# Patient Record
Sex: Male | Born: 1955 | Race: White | Hispanic: No | Marital: Single | Smoking: Never smoker
Health system: Southern US, Community
[De-identification: ages and names within clinical notes are randomized; demographics above are authoritative.]

## PROBLEM LIST (undated history)

## (undated) DIAGNOSIS — I10 Essential (primary) hypertension: Secondary | ICD-10-CM

## (undated) DIAGNOSIS — E785 Hyperlipidemia, unspecified: Secondary | ICD-10-CM

## (undated) HISTORY — DX: Essential (primary) hypertension: I10

## (undated) HISTORY — PX: HERNIA REPAIR: SHX51

## (undated) HISTORY — PX: CARDIAC SURGERY: SHX584

## (undated) HISTORY — DX: Hyperlipidemia, unspecified: E78.5

---

## 1998-09-11 ENCOUNTER — Ambulatory Visit (HOSPITAL_BASED_OUTPATIENT_CLINIC_OR_DEPARTMENT_OTHER): Admission: RE | Admit: 1998-09-11 | Discharge: 1998-09-11 | Payer: Self-pay | Admitting: Surgery

## 2000-09-13 ENCOUNTER — Encounter (HOSPITAL_COMMUNITY): Admission: RE | Admit: 2000-09-13 | Discharge: 2000-10-13 | Payer: Self-pay | Admitting: Preventative Medicine

## 2006-05-10 HISTORY — PX: COLONOSCOPY: SHX174

## 2007-04-17 ENCOUNTER — Ambulatory Visit (HOSPITAL_COMMUNITY): Admission: RE | Admit: 2007-04-17 | Discharge: 2007-04-17 | Payer: Self-pay | Admitting: Internal Medicine

## 2007-04-17 ENCOUNTER — Ambulatory Visit: Payer: Self-pay | Admitting: Internal Medicine

## 2009-09-26 ENCOUNTER — Encounter: Admission: RE | Admit: 2009-09-26 | Discharge: 2009-09-26 | Payer: Self-pay | Admitting: Otolaryngology

## 2010-09-22 NOTE — Op Note (Signed)
Victor Patel, Victor Patel              ACCOUNT NO.:  [id number]   MEDICAL RECORD NO.:  [id number]          PATIENT TYPE:  AMB   LOCATION:  DAY                           FACILITY:  [facility]   PHYSICIAN:  R. Roetta Sessions, M.D. DATE OF BIRTH:  03/11/1956   DATE OF PROCEDURE:  04/17/2007  DATE OF DISCHARGE:                               OPERATIVE REPORT   PROCEDURE:  Ileal colonoscopy.   INDICATIONS FOR PROCEDURE:  A 55 year old gentleman sent over at the  courtesy of W. Simone Curia, M.D., for colorectal cancer screening.  He is devoid of the lower GI tract symptoms.  He has never his lower GI  tract imaged.  There is no family history colorectal neoplasia.  Colonoscopy is now being done as a screening maneuver.  This approach  has been discussed with the patient at length.  The potential  risks,  benefits and alternatives have been reviewed, questions answered, he is  agreeable.  Please see the documentation in the medical record.   PROCEDURE NOTE:  O2 saturation, blood pressure, pulse and respirations  were monitor throughout the entirety of the procedure.   CONSCIOUS SEDATION:  Versed 5 mg IV, Demerol 75 mg IV in divided doses.   INSTRUMENT:  Pentax video chip system.   FINDINGS:  Digital rectal exam revealed no abnormalities.   ENDOSCOPIC FINDINGS:  The prep was good.   Colon:  Colonic mucosa was surveyed from the rectosigmoid junction,  through the left transverse right colon to the area of the appendiceal  orifice and ileocecal valve and cecum.  These structures were well seen  and photographed for the record.  Terminal ileum was intubated at 10 cm.  From this level, the scope was slowly, cautiously withdrawn.  All  previously mentioned mucosal surfaces were again seen.  The colonic  mucosa appeared normal as did the terminal ileal mucosa.  The scope was  pulled out of the rectum where thorough examination of the rectal mucosa  including retroflexed view of the anal verge  demonstrated only some  minimal internal hemorrhoids.  The patient tolerated the procedure well  as reactive to endoscopy.   IMPRESSION:  Minimal internal hemorrhoids, otherwise normal rectum,  colon and terminal ileum.   RECOMMENDATIONS:  Repeat screening colonoscopy in 10 years.      Jonathon Bellows, M.D.  Electronically Signed     RMR/MEDQ  D:  04/17/2007  T:  04/17/2007  Job:  [id number]   cc:   Donna Bernard, M.D.  Fax: [phone]

## 2012-08-14 ENCOUNTER — Ambulatory Visit (INDEPENDENT_AMBULATORY_CARE_PROVIDER_SITE_OTHER): Payer: PRIVATE HEALTH INSURANCE | Admitting: Surgery

## 2012-08-14 ENCOUNTER — Encounter (INDEPENDENT_AMBULATORY_CARE_PROVIDER_SITE_OTHER): Payer: Self-pay | Admitting: Surgery

## 2012-08-14 VITALS — BP 110/60 | HR 78 | Temp 97.6°F | Resp 18 | Ht 69.0 in | Wt 170.0 lb

## 2012-08-14 DIAGNOSIS — K4090 Unilateral inguinal hernia, without obstruction or gangrene, not specified as recurrent: Secondary | ICD-10-CM

## 2012-08-14 DIAGNOSIS — D492 Neoplasm of unspecified behavior of bone, soft tissue, and skin: Secondary | ICD-10-CM

## 2012-08-14 NOTE — Patient Instructions (Signed)
[facility]  HERNIA REPAIR POST OP INSTRUCTIONS  Always review your discharge instruction sheet given to you by the facility where your surgery was performed.  1. A  prescription for pain medication may be given to you upon discharge.  Take your pain medication as prescribed.  If narcotic pain medicine is not needed, then you may take acetaminophen (Tylenol) or ibuprofen (Advil) as needed.  2. Take your usually prescribed medications unless otherwise directed.  3. If you need a refill on your pain medication, please contact your pharmacy.  They will contact our office to request authorization. Prescriptions will not be filled after 5 pm daily or on weekends.  4. You should follow a light diet the first 24 hours after arrival home, such as soup and crackers or toast.  Be sure to include plenty of fluids daily.  Resume your normal diet the day after surgery.  5. Most patients will experience some swelling and bruising around the surgical site.  Ice packs and reclining will help.  Swelling and bruising can take several days to resolve.   6. It is common to experience some constipation if taking pain medication after surgery.  Increasing fluid intake and taking a stool softener (such as Colace) will usually help or prevent this problem from occurring.  A mild laxative (Milk of Magnesia or Miralax) should be taken according to package directions if there are no bowel movements after 48 hours.  7. Unless discharge instructions indicate otherwise, you may remove your bandages 24-48 hours after surgery, and you may shower at that time.  You may have steri-strips (small skin tapes) in place directly over the incision.  These strips should be left on the skin for 7-10 days.  If your surgeon used skin glue on the incision, you may shower in 24 hours.  The glue will flake off over the next 2-3 weeks.  Any sutures or staples will be removed at the office during your follow-up  visit.  8. ACTIVITIES:  You may resume regular (light) daily activities beginning the next day-such as daily self-care, walking, climbing stairs-gradually increasing activities as tolerated.  You may have sexual intercourse when it is comfortable.  Refrain from any heavy lifting or straining until approved by your doctor.  You may drive when you are no longer taking prescription pain medication, you can comfortably wear a seatbelt, and you can safely maneuver your car and apply brakes.  9. You should see your doctor in the office for a follow-up appointment approximately 2-3 weeks after your surgery.  Make sure that you call for this appointment within a day or two after you arrive home to insure a convenient appointment time. 10.   WHEN TO CALL YOUR DOCTOR: 1. Fever greater than 101.0 2. Inability to urinate 3. Persistent nausea and/or vomiting 4. Extreme swelling or bruising 5. Continued bleeding from incision 6. Increased pain, redness, or drainage from the incision  The clinic staff is available to answer your questions during regular business hours.  Please don't hesitate to call and ask to speak to one of the nurses for clinical concerns.  If you have a medical emergency, go to the nearest emergency room or call 911.  A surgeon from [facility] is always on call for the hospital.   [facility] [address], [address], [place], [place]  [address]  [phone] ? [phone] ? FAX [phone]  [email or web address]   

## 2012-08-14 NOTE — Progress Notes (Signed)
General Surgery [facility]  Chief Complaint  Patient presents with  . New Evaluation    eval inguinal hernia    HISTORY: Patient is a 57 year old white male who is self-referred for suspected inguinal hernia. Patient first noted discomfort and a bulge in the right groin in January 2014. He has had mild pain in the left groin. He has had no prior hernia repairs. He has had no prior abdominal surgery. Patient also notes a soft tissue mass on the left hip. This causes discomfort when sitting, especially in a hard chair or bench. He has gradually increased in size. He desires evaluation and surgical management for both problems.  Patient denies any signs or symptoms of intestinal obstruction.  Significant past medical history is repair of a cardiac septal defect as a 64-year-old child at [facility]. He has had no complications. He does not require antibiotic prophylaxis for dental procedures.  History reviewed. No pertinent past medical history.   No current outpatient prescriptions on file.   No current facility-administered medications for this visit.     No Known Allergies   No family history on file.   History   Social History  . Marital Status: Single    Spouse Name: N/A    Number of Children: N/A  . Years of Education: N/A   Social History Main Topics  . Smoking status: Never Smoker   . Smokeless tobacco: None  . Alcohol Use: No  . Drug Use: No  . Sexually Active: None   Other Topics Concern  . None   Social History Narrative  . None     REVIEW OF SYSTEMS - PERTINENT POSITIVES ONLY: Denies signs or symptoms of intestinal obstruction. Always reducible. No prior hernia surgery.  EXAM: Filed Vitals:   08/14/12 0902  BP: 110/60  Pulse: 78  Temp: 97.6 F (36.4 C)  Resp: 18    HEENT: normocephalic; pupils equal and reactive; sclerae clear; dentition good; mucous membranes moist NECK:  symmetric on extension; no  palpable anterior or posterior cervical lymphadenopathy; no supraclavicular masses; no tenderness CHEST: clear to auscultation bilaterally without rales, rhonchi, or wheezes CARDIAC: regular rate and rhythm without significant murmur; peripheral pulses are full ABDOMEN: soft without distension; bowel sounds present; no mass; no hepatosplenomegaly; no hernia GU:  Normal male without lesion. Small bulge in right groin on visual inspection. Palpation in inguinal canal with cough and Valsalva shows a small inguinal hernia which is spontaneously reducible. It is mildly tender. Palpation in the left inguinal canal shows no sign of inguinal hernia. BACK:  Soft tissue mass over posterior iliac crest, approximately 4 cm in size, mobile, nontender, consistent with lipoma EXT:  non-tender without edema; no deformity NEURO: no gross focal deficits; no sign of tremor   LABORATORY RESULTS: See [facility] (CHL-Epic) for most recent results   RADIOLOGY RESULTS: See [facility] (CHL-Epic) for most recent results   IMPRESSION: #1 right inguinal hernia, reducible, moderately symptomatic #2 soft tissue mass left posterior hip, 4 cm, likely lipoma  PLAN: The patient and I discussed both of these problems at length. I have recommended repair of right inguinal hernia with mesh by open technique. I have provided him with written literature to review. We have discussed the risk and benefits of the procedure including the risk of recurrence. We have discussed restrictions on his activities following the surgery. He understands and wishes to proceed.  Patient also desires concurrent resection of the soft tissue mass  from the left posterior hip. It has been enlarging and does cause discomfort. It is likely a benign mass. This will be excised concurrently.  The risks and benefits of the procedure have been discussed at length with the patient.  The patient understands the proposed procedure, potential  alternative treatments, and the course of recovery to be expected.  All of the patient's questions have been answered at this time.  The patient wishes to proceed with surgery.  Velora Heckler, MD, FACS General & Endocrine Surgery [facility]   Visit Diagnoses: 1. Inguinal hernia unilateral, non-recurrent, right   2. Neoplasm of soft tissue, left posterior hip     Primary Care Physician: Lilyan Punt, MD

## 2012-08-21 ENCOUNTER — Other Ambulatory Visit (INDEPENDENT_AMBULATORY_CARE_PROVIDER_SITE_OTHER): Payer: Self-pay | Admitting: Surgery

## 2012-08-21 ENCOUNTER — Ambulatory Visit: Admit: 2012-08-21 | Payer: Self-pay | Admitting: Surgery

## 2012-08-21 DIAGNOSIS — K4090 Unilateral inguinal hernia, without obstruction or gangrene, not specified as recurrent: Secondary | ICD-10-CM

## 2012-08-21 DIAGNOSIS — D1739 Benign lipomatous neoplasm of skin and subcutaneous tissue of other sites: Secondary | ICD-10-CM

## 2012-08-21 SURGERY — REPAIR, HERNIA, INGUINAL, ADULT
Anesthesia: General | Site: Hip | Laterality: Right

## 2012-08-24 ENCOUNTER — Telehealth (INDEPENDENT_AMBULATORY_CARE_PROVIDER_SITE_OTHER): Payer: Self-pay

## 2012-08-24 NOTE — Telephone Encounter (Signed)
Pt home doing well. Po appt made. 

## 2012-09-06 ENCOUNTER — Encounter (INDEPENDENT_AMBULATORY_CARE_PROVIDER_SITE_OTHER): Payer: Self-pay

## 2012-09-06 ENCOUNTER — Encounter (INDEPENDENT_AMBULATORY_CARE_PROVIDER_SITE_OTHER): Payer: Self-pay | Admitting: Surgery

## 2012-09-06 ENCOUNTER — Ambulatory Visit (INDEPENDENT_AMBULATORY_CARE_PROVIDER_SITE_OTHER): Payer: PRIVATE HEALTH INSURANCE | Admitting: Surgery

## 2012-09-06 VITALS — BP 130/84 | HR 69 | Temp 97.2°F | Resp 18 | Ht 69.0 in | Wt 169.2 lb

## 2012-09-06 DIAGNOSIS — K4090 Unilateral inguinal hernia, without obstruction or gangrene, not specified as recurrent: Secondary | ICD-10-CM

## 2012-09-06 DIAGNOSIS — D492 Neoplasm of unspecified behavior of bone, soft tissue, and skin: Secondary | ICD-10-CM

## 2012-09-06 NOTE — Patient Instructions (Signed)
  COCOA BUTTER & VITAMIN E CREAM  ([employer] or other brand)  Apply cocoa butter/vitamin E cream to your incision 2 - 3 times daily.  Massage cream into incision for one minute with each application.  Use sunscreen (50 SPF or higher) for first 6 months after surgery if area is exposed to sun.  You may substitute Mederma or other scar reducing creams as desired.   

## 2012-09-06 NOTE — Progress Notes (Signed)
General Surgery [facility]  Visit Diagnoses: 1. Neoplasm of soft tissue, left posterior hip   2. Inguinal hernia unilateral, non-recurrent, right     HISTORY: The patient returns for her first postoperative visit having undergone right inguinal hernia repair with mesh on 08/21/2012. Concurrently he underwent excision of a lipoma from the left posterior hip. Final pathology showed a 6 cm mass with benign histology.  EXAM: Surgical incisions are well-healed. No sign of infection. Small seroma left posterior hip. On palpation with cough and Valsalva there is no sign of recurrent hernia.  IMPRESSION: #1 status post right inguinal hernia repair with mesh #2 status post excision of lipoma left posterior hip, 6 cm  PLAN: Patient will begin applying topical creams to his incisions. His lifting is restricted to 25 pounds for the next 2 weeks. He will return to work next week. He will return to see me in 6 weeks for final wound checks.  Velora Heckler, MD, FACS General & Endocrine Surgery [facility]

## 2012-09-12 ENCOUNTER — Telehealth: Payer: Self-pay | Admitting: Family Medicine

## 2012-09-12 DIAGNOSIS — Z7251 High risk heterosexual behavior: Secondary | ICD-10-CM

## 2012-09-12 DIAGNOSIS — E785 Hyperlipidemia, unspecified: Secondary | ICD-10-CM

## 2012-09-12 DIAGNOSIS — Z125 Encounter for screening for malignant neoplasm of prostate: Secondary | ICD-10-CM

## 2012-09-12 DIAGNOSIS — Z79899 Other long term (current) drug therapy: Secondary | ICD-10-CM

## 2012-09-12 NOTE — Telephone Encounter (Signed)
Patient needs BW paperwork °

## 2012-09-13 NOTE — Telephone Encounter (Signed)
Blood work papers printed and left up front for patient pick up. Patient notified. 

## 2012-09-13 NOTE — Telephone Encounter (Signed)
Pt calling to see where his BW papers are and if they will be done today..he would like to get this done. Thanks

## 2012-09-13 NOTE — Telephone Encounter (Signed)
Pt is also wanting to know if he can have an STD screen ran with this please

## 2012-09-13 NOTE — Telephone Encounter (Signed)
I would recommend metabolic 7, lipid profile, liver profile, and PSA. In regards to STD testing I recommend: HIV antibody, VDRL.

## 2012-09-14 LAB — BASIC METABOLIC PANEL
BUN: 13 mg/dL (ref 6–23)
Chloride: 102 mEq/L (ref 96–112)
Glucose, Bld: 100 mg/dL — ABNORMAL HIGH (ref 70–99)
Potassium: 4.5 mEq/L (ref 3.5–5.3)

## 2012-09-14 LAB — HEPATIC FUNCTION PANEL
Albumin: 4.4 g/dL (ref 3.5–5.2)
Bilirubin, Direct: 0.1 mg/dL (ref 0.0–0.3)
Indirect Bilirubin: 0.7 mg/dL (ref 0.0–0.9)

## 2012-09-14 LAB — LIPID PANEL: Triglycerides: 86 mg/dL (ref <150)

## 2012-09-14 LAB — HIV ANTIBODY (ROUTINE TESTING W REFLEX): HIV: NONREACTIVE

## 2012-10-04 ENCOUNTER — Encounter: Payer: Self-pay | Admitting: *Deleted

## 2012-10-09 ENCOUNTER — Ambulatory Visit (INDEPENDENT_AMBULATORY_CARE_PROVIDER_SITE_OTHER): Payer: PRIVATE HEALTH INSURANCE | Admitting: Family Medicine

## 2012-10-09 ENCOUNTER — Encounter: Payer: Self-pay | Admitting: Family Medicine

## 2012-10-09 VITALS — BP 132/80 | HR 70 | Ht 67.25 in | Wt 170.0 lb

## 2012-10-09 DIAGNOSIS — E785 Hyperlipidemia, unspecified: Secondary | ICD-10-CM | POA: Insufficient documentation

## 2012-10-09 NOTE — Progress Notes (Signed)
Subjective:    Patient ID: Victor Patel, male    DOB: 13-May-1955, 57 y.o.   MRN: [id number]  HPI Still exercising relatively well. Had hernia repaired. Cyst resolved surgically.  Results for orders placed in visit on 09/12/12  HIV ANTIBODY (ROUTINE TESTING)      Result Value Range   HIV NON REACTIVE  NON REACTIVE  BASIC METABOLIC PANEL      Result Value Range   Sodium 141  135 - 145 mEq/L   Potassium 4.5  3.5 - 5.3 mEq/L   Chloride 102  96 - 112 mEq/L   CO2 29  19 - 32 mEq/L   Glucose, Bld 100 (*) 70 - 99 mg/dL   BUN 13  6 - 23 mg/dL   Creat 4.09  8.11 - 9.14 mg/dL   Calcium 9.4  8.4 - 78.2 mg/dL  LIPID PANEL      Result Value Range   Cholesterol 214 (*) 0 - 200 mg/dL   Triglycerides 86  <956 mg/dL   HDL 58  >21 mg/dL   Total CHOL/HDL Ratio 3.7     VLDL 17  0 - 40 mg/dL   LDL Cholesterol 308 (*) 0 - 99 mg/dL  HEPATIC FUNCTION PANEL      Result Value Range   Total Bilirubin 0.8  0.3 - 1.2 mg/dL   Bilirubin, Direct 0.1  0.0 - 0.3 mg/dL   Indirect Bilirubin 0.7  0.0 - 0.9 mg/dL   Alkaline Phosphatase 57  39 - 117 U/L   AST 15  0 - 37 U/L   ALT 13  0 - 53 U/L   Total Protein 6.7  6.0 - 8.3 g/dL   Albumin 4.4  3.5 - 5.2 g/dL  PSA      Result Value Range   PSA 3.71  <=4.00 ng/mL     Review of Systems  Constitutional: Negative for fever, activity change and appetite change.  HENT: Negative for congestion, rhinorrhea and neck pain.   Eyes: Negative for discharge.  Respiratory: Negative for cough and wheezing.   Cardiovascular: Negative for chest pain.  Gastrointestinal: Negative for vomiting, abdominal pain and blood in stool.  Genitourinary: Negative for frequency and difficulty urinating.  Skin: Negative for rash.  Allergic/Immunologic: Negative for environmental allergies and food allergies.  Neurological: Negative for weakness and headaches.  Psychiatric/Behavioral: Negative for agitation.       Objective:   Physical Exam  Constitutional: He appears  well-developed and well-nourished.  HENT:  Head: Normocephalic and atraumatic.  Right Ear: External ear normal.  Left Ear: External ear normal.  Nose: Nose normal.  Mouth/Throat: Oropharynx is clear and moist.  Eyes: EOM are normal. Pupils are equal, round, and reactive to light.  Neck: Normal range of motion. Neck supple. No thyromegaly present.  Cardiovascular: Normal rate, regular rhythm and normal heart sounds.   No murmur heard. Pulmonary/Chest: Effort normal and breath sounds normal. No respiratory distress. He has no wheezes.  Abdominal: Soft. Bowel sounds are normal. He exhibits no distension and no mass. There is no tenderness.  Genitourinary: Penis normal.  Musculoskeletal: Normal range of motion. He exhibits no edema.  Lymphadenopathy:    He has no cervical adenopathy.  Neurological: He is alert. He exhibits normal muscle tone.  Skin: Skin is warm and dry. No erythema.  Psychiatric: He has a normal mood and affect. His behavior is normal. Judgment normal.          Assessment & Plan:  Impression physical exam. #2  hyperlipidemia clinically stable. #3 history of elevated blood pressure stable. Plan diet and exercise discussed. Of note had negative colonoscopy in 08 to repeat in 10 years. Diet exercise discussed. WSL

## 2012-10-09 NOTE — Patient Instructions (Signed)
Keep exercising regularly

## 2012-10-23 ENCOUNTER — Ambulatory Visit (INDEPENDENT_AMBULATORY_CARE_PROVIDER_SITE_OTHER): Payer: PRIVATE HEALTH INSURANCE | Admitting: Surgery

## 2012-10-23 ENCOUNTER — Encounter (INDEPENDENT_AMBULATORY_CARE_PROVIDER_SITE_OTHER): Payer: Self-pay | Admitting: Surgery

## 2012-10-23 VITALS — BP 106/68 | HR 65 | Temp 98.6°F | Resp 16 | Ht 69.0 in | Wt 172.2 lb

## 2012-10-23 DIAGNOSIS — D492 Neoplasm of unspecified behavior of bone, soft tissue, and skin: Secondary | ICD-10-CM

## 2012-10-23 DIAGNOSIS — K4090 Unilateral inguinal hernia, without obstruction or gangrene, not specified as recurrent: Secondary | ICD-10-CM

## 2012-10-23 NOTE — Progress Notes (Signed)
General Surgery [facility]  Visit Diagnoses: 1. Inguinal hernia unilateral, non-recurrent, right   2. Neoplasm of soft tissue, left posterior hip     HISTORY: Patient returns for postop visit having undergone repair of right inguinal hernia and excision of left hip lipoma concurrently in mid April 2014. Postoperative course has been uneventful. He is returned to work. He is anxious to return to full physical activity. He has no complaints.  EXAM: Surgical incisions are well-healed. No sign of seroma. No sign of infection. Palpation in the inguinal canal with cough and Valsalva shows no sign of recurrence.  IMPRESSION: #1 status post right inguinal hernia repair with mesh #2 status post excision of lipoma left hip  PLAN: Patient is released to full activity without restriction. He will return for surgical care as needed.  Velora Heckler, MD, FACS General & Endocrine Surgery [facility]

## 2012-10-23 NOTE — Patient Instructions (Signed)
  COCOA BUTTER & VITAMIN E CREAM  ([employer] or other brand)  Apply cocoa butter/vitamin E cream to your incision 2 - 3 times daily.  Massage cream into incision for one minute with each application.  Use sunscreen (50 SPF or higher) for first 6 months after surgery if area is exposed to sun.  You may substitute Mederma or other scar reducing creams as desired.   

## 2013-03-12 ENCOUNTER — Telehealth: Payer: Self-pay | Admitting: Family Medicine

## 2013-03-12 MED ORDER — METAXALONE 800 MG PO TABS: 800.0000 mg | ORAL_TABLET | Freq: Three times a day (TID) | ORAL | Status: DC

## 2013-03-12 NOTE — Telephone Encounter (Signed)
Skelaxin 800, one 3 times a day when necessary, cautioned drowsiness, #24, if ongoing trouble recommend followup

## 2013-03-12 NOTE — Telephone Encounter (Signed)
Strain in his mid back where he was lifting something, this was done yesterday and not getting any better. Wants to know if we have a muscle relaxer to help? [facility]

## 2013-03-12 NOTE — Telephone Encounter (Signed)
Med sent to pharm. Pt notified.  

## 2014-05-10 DIAGNOSIS — C4490 Unspecified malignant neoplasm of skin, unspecified: Secondary | ICD-10-CM | POA: Insufficient documentation

## 2015-09-01 ENCOUNTER — Telehealth: Payer: Self-pay | Admitting: Family Medicine

## 2015-09-01 NOTE — Telephone Encounter (Signed)
Sorry last seen almost three yrs ago, needs o v first

## 2015-09-01 NOTE — Telephone Encounter (Signed)
Pt is wanting to know if he can get a refill on diclofenac sent in.       [facility]

## 2015-09-01 NOTE — Telephone Encounter (Signed)
Transferred to front to schedule appointment.   

## 2015-09-02 ENCOUNTER — Ambulatory Visit (INDEPENDENT_AMBULATORY_CARE_PROVIDER_SITE_OTHER): Payer: PRIVATE HEALTH INSURANCE | Admitting: Family Medicine

## 2015-09-02 ENCOUNTER — Encounter: Payer: Self-pay | Admitting: Family Medicine

## 2015-09-02 VITALS — BP 114/80 | Ht 67.25 in | Wt 180.4 lb

## 2015-09-02 DIAGNOSIS — M7741 Metatarsalgia, right foot: Secondary | ICD-10-CM

## 2015-09-02 MED ORDER — DICLOFENAC SODIUM 75 MG PO TBEC: 75.0000 mg | DELAYED_RELEASE_TABLET | Freq: Two times a day (BID) | ORAL | Status: DC

## 2015-09-02 MED ORDER — PREDNISONE 20 MG PO TABS: ORAL_TABLET | ORAL | Status: DC

## 2015-09-02 NOTE — Progress Notes (Signed)
   Subjective:    Patient ID: Victor Patel, male    DOB: 08/28/1955, 60 y.o.   MRN: [id number]  Foot Pain This is a recurrent problem. The current episode started in the past 7 days. The symptoms are aggravated by walking. He has tried ice and NSAIDs for the symptoms. The treatment provided no relief.   history recurrent foot pain. At one time felt to be gout. Not at this time however. Flares up now 9 particularly with excess activity such as climbing ladders etc.  In the midst of a flare the past week. Over-the-counter medications not helping. Very sore   Patient states no other concerns this visit.  Review of Systems No ankle pain no fever no knee pain no injury ROS otherwise negative    Objective:   Physical Exam  Alert vital stable lungs clear. Heart rare rhythm right metatarsophalangeal joint inflamed tender positive pain with movement pulses good sensation good ankle normal      Assessment & Plan:  Impression metatarsalgia plan prednisone taper due to severity. Diclofenac when necessary in the future symptom care discussed WSL

## 2015-10-08 ENCOUNTER — Encounter: Payer: Self-pay | Admitting: Family Medicine

## 2015-10-08 ENCOUNTER — Ambulatory Visit (INDEPENDENT_AMBULATORY_CARE_PROVIDER_SITE_OTHER): Payer: PRIVATE HEALTH INSURANCE | Admitting: Family Medicine

## 2015-10-08 VITALS — BP 130/82 | Ht 67.25 in | Wt 173.2 lb

## 2015-10-08 DIAGNOSIS — Z0000 Encounter for general adult medical examination without abnormal findings: Secondary | ICD-10-CM | POA: Diagnosis not present

## 2015-10-08 DIAGNOSIS — Z125 Encounter for screening for malignant neoplasm of prostate: Secondary | ICD-10-CM

## 2015-10-08 NOTE — Progress Notes (Signed)
   Subjective:    Patient ID: Victor Patel, male    DOB: [birth year], 60 y.o.   MRN: [id number]  HPI  The patient comes in today for a wellness visit.  Exercises reulaly   Works out four days per wk  Good reg exrecise   A review of their health history was completed.  A review of medications was also completed.  Any needed refills; no  Eating habits: trying to eat healthy  Falls/  MVA accidents in past few months: no  Regular exercise: 6-8 hrs a week  Specialist pt sees on regular basis: none  Preventative health issues were discussed.   Additional concerns: none  Colon done at age 58   Dr rourke    foot calmed down overall fieeling beter, calmed down after the round of    Review of Systems  Constitutional: Negative for fever, activity change and appetite change.  HENT: Negative for congestion and rhinorrhea.   Eyes: Negative for discharge.  Respiratory: Negative for cough and wheezing.   Cardiovascular: Negative for chest pain.  Gastrointestinal: Negative for vomiting, abdominal pain and blood in stool.  Genitourinary: Negative for frequency and difficulty urinating.  Musculoskeletal: Negative for neck pain.  Skin: Negative for rash.  Allergic/Immunologic: Negative for environmental allergies and food allergies.  Neurological: Negative for weakness and headaches.  Psychiatric/Behavioral: Negative for agitation.  All other systems reviewed and are negative.      Objective:   Physical Exam  Constitutional: He appears well-developed and well-nourished. No distress.  HENT:  Head: Normocephalic and atraumatic.  Right Ear: External ear normal.  Left Ear: External ear normal.  Nose: Nose normal.  Mouth/Throat: Oropharynx is clear and moist.  Eyes: EOM are normal. Pupils are equal, round, and reactive to light.  Neck: Normal range of motion. Neck supple. No thyromegaly present.  Cardiovascular: Normal rate, regular rhythm and normal heart sounds.   No  murmur heard. Pulmonary/Chest: Effort normal and breath sounds normal. No respiratory distress. He has no wheezes.  Abdominal: Soft. Bowel sounds are normal. He exhibits no distension and no mass. There is no tenderness.  Genitourinary: Penis normal.  Musculoskeletal: Normal range of motion. He exhibits no edema.  Lymphadenopathy:    He has no cervical adenopathy.  Neurological: He is alert. He exhibits normal muscle tone.  Skin: Skin is warm and dry. No erythema.  Psychiatric: He has a normal mood and affect. His behavior is normal. Judgment normal.  Vitals reviewed.   Prostate within normal limits slight crepitations left knee    Assessment & Plan:  Impression well adult exam #2 hyperlipidemia LDL elevated mid 140s but HDL also excellent at 55 total cholesterol over HDL 4.0 rationale for no medications discussed plan add PSA. Hemoccult cards. Diet exercise discussed. Colonoscopy due next year WSL

## 2015-10-09 LAB — PSA: PROSTATE SPECIFIC AG, SERUM: 5.5 ng/mL — AB (ref 0.0–4.0)

## 2015-10-14 ENCOUNTER — Encounter: Payer: Self-pay | Admitting: Family Medicine

## 2015-10-14 ENCOUNTER — Ambulatory Visit (INDEPENDENT_AMBULATORY_CARE_PROVIDER_SITE_OTHER): Payer: PRIVATE HEALTH INSURANCE | Admitting: Family Medicine

## 2015-10-14 VITALS — BP 148/90 | Ht 67.25 in | Wt 173.0 lb

## 2015-10-14 DIAGNOSIS — R972 Elevated prostate specific antigen [PSA]: Secondary | ICD-10-CM | POA: Diagnosis not present

## 2015-10-14 NOTE — Progress Notes (Signed)
   Subjective:    Patient ID: Victor Patel, male    DOB: 1955/11/22, 60 y.o.   MRN: [id number]  HPIpt arrives today to go over lab results. psa elevated on bloodwork.   No sig fam hx of prost cancer, hx of psa elevation.  Patient arrives for a protracted discussion regarding his PSA. He has not had a PSA for 3 years. At that time was 3.7.  Patient's PSA last week was 5.5. Of note this was performed after a prostate exam  Patient notes he also has been exerting himself with a very high physical activity regimen and he has read that this can result and elevated PSA also  Occasional urinary hesitancy. Most nights goes all night without sleeping next  No hematuria next  No close family history of prostate cancer Dr Jeffie Pollock  sched  Day # [phone]  Can go to gr bor      Review of Systems No headache, no major weight loss or weight gain, no chest pain no back pain abdominal pain no change in bowel habits complete ROS otherwise negative     Objective:   Physical Exam  Alert lungs clear. Heart regular rhythm. HEENT normal. Abdomen benign.      Assessment & Plan:  Impression elevated PSA long discussion held. Since this PSA was obtained immediately after a prostate exam will repeat. If still elevated need to refer on work. Many questions answered. 25 minutes spent most in discussion WSL

## 2015-10-15 ENCOUNTER — Encounter: Payer: Self-pay | Admitting: Family Medicine

## 2015-10-15 NOTE — Addendum Note (Signed)
Addended by: Dairl Ponder on: 10/15/2015 08:44 AM   Modules accepted: Orders

## 2015-10-21 ENCOUNTER — Other Ambulatory Visit: Payer: Self-pay

## 2015-10-21 DIAGNOSIS — Z0000 Encounter for general adult medical examination without abnormal findings: Secondary | ICD-10-CM

## 2015-10-21 LAB — POC HEMOCCULT BLD/STL (HOME/3-CARD/SCREEN)
FECAL OCCULT BLD: NEGATIVE
FECAL OCCULT BLD: NEGATIVE
Fecal Occult Blood, POC: NEGATIVE

## 2016-11-22 ENCOUNTER — Encounter: Payer: Self-pay | Admitting: Family Medicine

## 2016-11-22 ENCOUNTER — Ambulatory Visit (INDEPENDENT_AMBULATORY_CARE_PROVIDER_SITE_OTHER): Payer: PRIVATE HEALTH INSURANCE | Admitting: Family Medicine

## 2016-11-22 VITALS — BP 134/86 | Ht 67.5 in | Wt 173.4 lb

## 2016-11-22 DIAGNOSIS — Z0000 Encounter for general adult medical examination without abnormal findings: Secondary | ICD-10-CM | POA: Diagnosis not present

## 2016-11-22 NOTE — Progress Notes (Signed)
   Subjective:    Patient ID: Victor Patel, male    DOB: 1955-11-12, 61 y.o.   MRN: [id number]  HPI The patient comes in today for a wellness visit.    A review of their health history was completed.  A review of medications was also completed.  Any needed refills; None   Eating habits: Patient states eats 6 times per day.Eats small portions.   Falls/  MVA accidents in past few months:  None   Regular exercise:  Patient states exercises daily 1-2 hours.   Specialist pt sees on regular basis:  [facility].   Preventative health issues were discussed.   Additional concerns:  Patient states no other concerns this visit.  Diet very good, reg ecrcise, doing well   118 over 70  PSA now down to 3.49  numbdrs   Never had to do biopsy     Review of Systems  Constitutional: Negative for activity change, appetite change and fever.  HENT: Negative for congestion and rhinorrhea.   Eyes: Negative for discharge.  Respiratory: Negative for cough and wheezing.   Cardiovascular: Negative for chest pain.  Gastrointestinal: Negative for abdominal pain, blood in stool and vomiting.  Genitourinary: Negative for difficulty urinating and frequency.  Musculoskeletal: Negative for neck pain.  Skin: Negative for rash.  Allergic/Immunologic: Negative for environmental allergies and food allergies.  Neurological: Negative for weakness and headaches.  Psychiatric/Behavioral: Negative for agitation.  All other systems reviewed and are negative.      Objective:   Physical Exam  Constitutional: He appears well-developed and well-nourished.  HENT:  Head: Normocephalic and atraumatic.  Right Ear: External ear normal.  Left Ear: External ear normal.  Nose: Nose normal.  Mouth/Throat: Oropharynx is clear and moist.  Eyes: Pupils are equal, round, and reactive to light. EOM are normal.  Neck: Normal range of motion. Neck supple. No thyromegaly present.  Cardiovascular: Normal rate,  regular rhythm and normal heart sounds.   No murmur heard. Pulmonary/Chest: Effort normal and breath sounds normal. No respiratory distress. He has no wheezes.  Abdominal: Soft. Bowel sounds are normal. He exhibits no distension and no mass. There is no tenderness.  Genitourinary: Penis normal.  Musculoskeletal: Normal range of motion. He exhibits no edema.  Lymphadenopathy:    He has no cervical adenopathy.  Neurological: He is alert. He exhibits normal muscle tone.  Skin: Skin is warm and dry. No erythema.  Psychiatric: He has a normal mood and affect. His behavior is normal. Judgment normal.  Vitals reviewed.         Assessment & Plan:  Impression well adult exam colonoscopy due this winter. Diet discussed exercise discussed. Followed by urologist for elevated PSA. Blood work reviewed. Immunizations discussed

## 2017-01-17 ENCOUNTER — Telehealth: Payer: Self-pay | Admitting: Family Medicine

## 2017-01-17 MED ORDER — FLUTICASONE PROPIONATE 50 MCG/ACT NA SUSP: 2.0000 | Freq: Every day | NASAL | 3 refills | Status: DC

## 2017-01-17 NOTE — Telephone Encounter (Signed)
I spoke with the Victor Patel and he states he has a lot of sinus drainage/mucus in head,and fluid in ears. Not running any fevers. Would like for [name] to send in a rx for flonase to [place] as he says this normally helps these issues for him.Please advise.

## 2017-01-17 NOTE — Telephone Encounter (Signed)
Prescription sent electronically to pharmacy. Patient notified. 

## 2017-01-17 NOTE — Telephone Encounter (Signed)
Patient said he is having allergy issues and would like Rx for Flonase called in to [place].

## 2017-01-17 NOTE — Telephone Encounter (Signed)
Ok two sprays e a nost qd  Plus three ref

## 2017-01-24 ENCOUNTER — Telehealth: Payer: Self-pay | Admitting: Internal Medicine

## 2017-01-24 NOTE — Telephone Encounter (Signed)
Pt said he was due for his 10 year colonoscopy and wasn't having any GI problems. He would like to be scheduled in the earliest spot available for his procedure. Please call him at [phone]

## 2017-01-31 ENCOUNTER — Telehealth: Payer: Self-pay

## 2017-01-31 NOTE — Telephone Encounter (Signed)
See separate triage.  

## 2017-02-01 ENCOUNTER — Telehealth: Payer: Self-pay

## 2017-02-01 ENCOUNTER — Other Ambulatory Visit: Payer: Self-pay

## 2017-02-01 DIAGNOSIS — Z1211 Encounter for screening for malignant neoplasm of colon: Secondary | ICD-10-CM

## 2017-02-01 MED ORDER — PEG 3350-KCL-NA BICARB-NACL 420 G PO SOLR: 4000.0000 mL | ORAL | 0 refills | Status: DC

## 2017-02-01 NOTE — Telephone Encounter (Signed)
Appropriate.

## 2017-02-01 NOTE — Telephone Encounter (Signed)
Pt said he spoke with DS yesterday and was letting her know that he checked with his insurance and he can have the colonoscopy anytime before the end of the year. He said that he and DS discussed having it done on Oct 26 at 0730. Please call him back at [phone]

## 2017-02-01 NOTE — Telephone Encounter (Signed)
Rx sent to the pharmacy and instructions mailed to pt.  

## 2017-02-01 NOTE — Telephone Encounter (Signed)
Noted! Pt is aware we will keep that date and time.

## 2017-02-01 NOTE — Telephone Encounter (Signed)
Gastroenterology Pre-Procedure Review  Request Date: 01/31/2017 Requesting Physician: RECALL  Last colonoscopy was 04/17/2007 by Dr. Gala Romney  PATIENT REVIEW QUESTIONS: The patient responded to the following health history questions as indicated:    1. Diabetes Melitis: no 2. Joint replacements in the past 12 months: no 3. Major health problems in the past 3 months: no 4. Has an artificial valve or MVP: no 5. Has a defibrillator: no 6. Has been advised in past to take antibiotics in advance of a procedure like teeth cleaning: no 7. Family history of colon cancer: no  8. Alcohol Use: no 9. History of sleep apnea: no  10. History of coronary artery or other vascular stents placed within the last 12 months: no 11. History of any prior anesthesia complications: no    MEDICATIONS & ALLERGIES:    Patient reports the following regarding taking any blood thinners:   Plavix? no Aspirin? YES Coumadin? no Brilinta? no Xarelto? no Eliquis? no Pradaxa? no Savaysa? no Effient? no  Patient confirms/reports the following medications:  Current Outpatient Prescriptions  Medication Sig Dispense Refill  . aspirin EC 81 MG tablet Take 81 mg by mouth daily.    . pseudoephedrine-guaifenesin (MUCINEX D) 60-600 MG 12 hr tablet Take 1 tablet by mouth every 12 (twelve) hours. As needed only    . fluticasone (FLONASE) 50 MCG/ACT nasal spray Place 2 sprays into both nostrils daily. 16 g 3   No current facility-administered medications for this visit.     Patient confirms/reports the following allergies:  No Known Allergies  No orders of the defined types were placed in this encounter.   AUTHORIZATION INFORMATION Primary Insurance:  ID #:   Group #:  Pre-Cert / Auth required:  Pre-Cert / Auth #:   Secondary Insurance:   ID #:   Group #:  Pre-Cert / Auth required:  Pre-Cert / Auth #:   SCHEDULE INFORMATION: Procedure has been scheduled as follows:  Date:  03/04/2017               Time: 7:30  AM  Location: [facility] Short Stay  This Gastroenterology Pre-Precedure Review Form is being routed to the following provider(s): R. Garfield Cornea, MD

## 2017-02-24 NOTE — Telephone Encounter (Signed)
I spoke to Robin A at [employer].  No PA required for the colonoscopy.

## 2017-03-01 ENCOUNTER — Other Ambulatory Visit (HOSPITAL_COMMUNITY): Payer: Self-pay

## 2017-03-04 ENCOUNTER — Encounter (HOSPITAL_COMMUNITY): Payer: Self-pay | Admitting: *Deleted

## 2017-03-04 ENCOUNTER — Ambulatory Visit (HOSPITAL_COMMUNITY)
Admission: RE | Admit: 2017-03-04 | Discharge: 2017-03-04 | Disposition: A | Discharge status: Home / Self Care | Payer: PRIVATE HEALTH INSURANCE | Source: Ambulatory Visit | Attending: Internal Medicine | Admitting: Internal Medicine

## 2017-03-04 ENCOUNTER — Encounter (HOSPITAL_COMMUNITY)
Admission: RE | Disposition: A | Discharge status: Home / Self Care | Payer: Self-pay | Source: Ambulatory Visit | Attending: Internal Medicine

## 2017-03-04 DIAGNOSIS — Z1211 Encounter for screening for malignant neoplasm of colon: Secondary | ICD-10-CM | POA: Diagnosis not present

## 2017-03-04 DIAGNOSIS — K5730 Diverticulosis of large intestine without perforation or abscess without bleeding: Secondary | ICD-10-CM | POA: Diagnosis not present

## 2017-03-04 DIAGNOSIS — I10 Essential (primary) hypertension: Secondary | ICD-10-CM | POA: Insufficient documentation

## 2017-03-04 DIAGNOSIS — Z7982 Long term (current) use of aspirin: Secondary | ICD-10-CM | POA: Diagnosis not present

## 2017-03-04 DIAGNOSIS — E785 Hyperlipidemia, unspecified: Secondary | ICD-10-CM | POA: Insufficient documentation

## 2017-03-04 DIAGNOSIS — Z79899 Other long term (current) drug therapy: Secondary | ICD-10-CM | POA: Diagnosis not present

## 2017-03-04 DIAGNOSIS — Z1212 Encounter for screening for malignant neoplasm of rectum: Secondary | ICD-10-CM

## 2017-03-04 HISTORY — PX: COLONOSCOPY: SHX5424

## 2017-03-04 SURGERY — COLONOSCOPY
Anesthesia: Moderate Sedation

## 2017-03-04 MED ORDER — SODIUM CHLORIDE 0.9 % IV SOLN
INTRAVENOUS | Status: DC
  Administered 2017-03-04: 07:00:00 via INTRAVENOUS

## 2017-03-04 MED ORDER — MEPERIDINE HCL 100 MG/ML IJ SOLN
INTRAMUSCULAR | Status: DC | PRN
  Administered 2017-03-04: 25 mg via INTRAVENOUS
  Administered 2017-03-04: 50 mg via INTRAVENOUS

## 2017-03-04 MED ORDER — MIDAZOLAM HCL 5 MG/5ML IJ SOLN
INTRAMUSCULAR | Status: DC | PRN
  Administered 2017-03-04 (×2): 2 mg via INTRAVENOUS

## 2017-03-04 MED ORDER — SIMETHICONE 40 MG/0.6ML PO SUSP
ORAL | Status: AC
  Filled 2017-03-04: qty 30

## 2017-03-04 MED ORDER — ONDANSETRON HCL 4 MG/2ML IJ SOLN
INTRAMUSCULAR | Status: DC | PRN
  Administered 2017-03-04: 4 mg via INTRAVENOUS

## 2017-03-04 MED ORDER — MIDAZOLAM HCL 5 MG/5ML IJ SOLN
INTRAMUSCULAR | Status: AC
  Filled 2017-03-04: qty 10

## 2017-03-04 MED ORDER — MEPERIDINE HCL 100 MG/ML IJ SOLN
INTRAMUSCULAR | Status: AC
  Filled 2017-03-04: qty 2

## 2017-03-04 MED ORDER — ONDANSETRON HCL 4 MG/2ML IJ SOLN
INTRAMUSCULAR | Status: AC
  Filled 2017-03-04: qty 2

## 2017-03-04 NOTE — H&P (Signed)
@  XNAT@   Primary Care Physician:  Mikey Kirschner, MD Primary Gastroenterologist:  Dr. Gala Romney  Pre-Procedure History & Physical: HPI:  Victor Patel is a 61 y.o. male is here for a screening colonoscopy. No bowel symptoms. No family history of colon cancer. Negative colonoscopy 10 years ago.  Past Medical History:  Diagnosis Date  . Hyperlipidemia   . Hypertension     Past Surgical History:  Procedure Laterality Date  . CARDIAC SURGERY    . COLONOSCOPY  2008  . HERNIA REPAIR Right [id number]    Prior to Admission medications   Medication Sig Start Date End Date Taking? Authorizing Provider  aspirin EC 81 MG tablet Take 81 mg by mouth daily.   Yes [provider]  fluticasone (FLONASE) 50 MCG/ACT nasal spray Place 2 sprays into both nostrils daily. Patient taking differently: Place 2 sprays into both nostrils daily as needed (for allergies.).  01/17/17  Yes Mikey Kirschner, MD  ibuprofen (ADVIL,MOTRIN) 200 MG tablet Take 400 mg by mouth every 8 (eight) hours as needed (for pain.).   Yes [provider]  polyethylene glycol-electrolytes (TRILYTE) 420 g solution Take 4,000 mLs by mouth as directed. 02/01/17  Yes Rourk, Cristopher Estimable, MD  pseudoephedrine-guaifenesin [facility] D) 60-600 MG 12 hr tablet Take 1 tablet by mouth every 12 (twelve) hours as needed (for nasal congestion.).    Yes [provider]  Soft Lens Products (REWETTING DROPS) SOLN Apply 1-2 drops to eye daily as needed (for contatct lenses.).   Yes [provider]    Allergies as of 02/01/2017  . (No Known Allergies)    History reviewed. No pertinent family history.  Social History   Social History  . Marital status: Single    Spouse name: N/A  . Number of children: N/A  . Years of education: N/A   Occupational History  . Not on file.   Social History Main Topics  . Smoking status: Never Smoker  . Smokeless tobacco: Never Used  . Alcohol use No  . Drug use: No  . Sexual  activity: Not on file   Other Topics Concern  . Not on file   Social History Narrative  . No narrative on file    Review of Systems: See HPI, otherwise negative ROS  Physical Exam: BP (!) 141/99   Pulse 75   Temp 97.7 F (36.5 C) (Oral)   Resp 12   Ht 5\' 9"  (1.753 m)   Wt 165 lb (74.8 kg)   SpO2 99%   BMI 24.37 kg/m  General:   Alert,  Well-developed, well-nourished, pleasant and cooperative in NAD Lungs:  Clear throughout to auscultation.   No wheezes, crackles, or rhonchi. No acute distress. Heart:  Regular rate and rhythm; no murmurs, clicks, rubs,  or gallops. Abdomen:  Soft, nontender and nondistended. No masses, hepatosplenomegaly or hernias noted. Normal bowel sounds, without guarding, and without rebound.   Impression/Plan: Victor Patel is now here to undergo a screening colonoscopy.  Average risk screening examination.   The risks, benefits, limitations, imponderables and alternatives regarding colonoscopy have been reviewed with the patient. Questions have been answered. All parties agreeable.     Notice:  This dictation was prepared with Dragon dictation along with smaller phrase technology. Any transcriptional errors that result from this process are unintentional and may not be corrected upon review.

## 2017-03-04 NOTE — Op Note (Addendum)
[facility] Patient Name: Victor Patel Procedure Date: 03/04/2017 7:32 AM MRN: [id number] Date of Birth: 1955/06/15 Attending MD: Norvel Richards , MD CSN: [id number] Age: 61 Admit Type: Outpatient Procedure:                Colonoscopy Indications:              Screening for colorectal malignant neoplasm Providers:                Norvel Richards, MD, Lurline Del, RN, Purcell Nails.                            Robinette, Merchant navy officer Referring MD:              Medicines:                Midazolam 4 mg IV, Meperidine 75 mg IV, Ondansetron                            4 mg IV Complications:            No immediate complications. Estimated Blood Loss:     Estimated blood loss: none. Procedure:                Pre-Anesthesia Assessment:                           - Prior to the procedure, a History and Physical                            was performed, and patient medications and                            allergies were reviewed. The patient's tolerance of                            previous anesthesia was also reviewed. The risks                            and benefits of the procedure and the sedation                            options and risks were discussed with the patient.                            All questions were answered, and informed consent                            was obtained. Prior Anticoagulants: The patient has                            taken no previous anticoagulant or antiplatelet                            agents. ASA Grade Assessment: III - A patient with  severe systemic disease. After reviewing the risks                            and benefits, the patient was deemed in                            satisfactory condition to undergo the procedure.                           After obtaining informed consent, the colonoscope                            was passed under direct vision. Throughout the                            procedure, the  patient's blood pressure, pulse, and                            oxygen saturations were monitored continuously. The                            EC-[id number] ([id number]) scope was introduced through                            the anus and advanced to the the cecum, identified                            by appendiceal orifice and ileocecal valve. The                            ileocecal valve, appendiceal orifice, and rectum                            were photographed. The entire colon was well                            visualized. The colonoscopy was performed without                            difficulty. The patient tolerated the procedure                            well. The quality of the bowel preparation was                            adequate. Scope In: 7:43:02 AM Scope Out: 7:55:13 AM Scope Withdrawal Time: 0 hours 7 minutes 12 seconds  Total Procedure Duration: 0 hours 12 minutes 11 seconds  Findings:      The perianal and digital rectal examinations were normal.      Scattered small and large-mouthed diverticula were found in the sigmoid       colon and descending colon.      The exam was otherwise without abnormality on direct and retroflexion       views. Impression:               -  Diverticulosis in the sigmoid colon and in the                            descending colon.                           - The examination was otherwise normal on direct                            and retroflexion views.                           - No specimens collected. Moderate Sedation:      Moderate (conscious) sedation was administered by the endoscopy nurse       and supervised by the endoscopist. The following parameters were       monitored: oxygen saturation, heart rate, blood pressure, respiratory       rate, EKG, adequacy of pulmonary ventilation, and response to care.       Total physician intraservice time was 17 minutes. Recommendation:           - Patient has a contact number available  for                            emergencies. The signs and symptoms of potential                            delayed complications were discussed with the                            patient. Return to normal activities tomorrow.                            Written discharge instructions were provided to the                            patient.                           - Resume previous diet.                           - Continue present medications.                           - Await pathology results.                           - Repeat colonoscopy in 10 years for screening                            purposes.                           - Return to GI clinic PRN. Procedure Code(s):        --- Professional ---  2528307860, Colonoscopy, flexible; diagnostic, including                            collection of specimen(s) by brushing or washing,                            when performed (separate procedure)                           99152, Moderate sedation services provided by the                            same physician or other qualified health care                            professional performing the diagnostic or                            therapeutic service that the sedation supports,                            requiring the presence of an independent trained                            observer to assist in the monitoring of the                            patient's level of consciousness and physiological                            status; initial 15 minutes of intraservice time,                            patient age 47 years or older Diagnosis Code(s):        --- Professional ---                           Z12.11, Encounter for screening for malignant                            neoplasm of colon                           K57.30, Diverticulosis of large intestine without                            perforation or abscess without bleeding CPT copyright 2016 American  Medical Association. All rights reserved. The codes documented in this report are preliminary and upon coder review may  be revised to meet current compliance requirements. Cristopher Estimable. Terreon Ekholm, MD Norvel Richards, MD 03/04/2017 8:06:39 AM This report has been signed electronically. Number of Addenda: 0

## 2017-03-04 NOTE — Discharge Instructions (Addendum)
Colonoscopy Discharge Instructions  Read the instructions outlined below and refer to this sheet in the next few weeks. These discharge instructions provide you with general information on caring for yourself after you leave the hospital. Your doctor may also give you specific instructions. While your treatment has been planned according to the most current medical practices available, unavoidable complications occasionally occur. If you have any problems or questions after discharge, call Dr. Gala Romney at [phone]. ACTIVITY  You may resume your regular activity, but move at a slower pace for the next 24 hours.   Take frequent rest periods for the next 24 hours.   Walking will help get rid of the air and reduce the bloated feeling in your belly (abdomen).   No driving for 24 hours (because of the medicine (anesthesia) used during the test).    Do not sign any important legal documents or operate any machinery for 24 hours (because of the anesthesia used during the test).  NUTRITION  Drink plenty of fluids.   You may resume your normal diet as instructed by your doctor.   Begin with a light meal and progress to your normal diet. Heavy or fried foods are harder to digest and may make you feel sick to your stomach (nauseated).   Avoid alcoholic beverages for 24 hours or as instructed.  MEDICATIONS  You may resume your normal medications unless your doctor tells you otherwise.  WHAT YOU CAN EXPECT TODAY  Some feelings of bloating in the abdomen.   Passage of more gas than usual.   Spotting of blood in your stool or on the toilet paper.  IF YOU HAD POLYPS REMOVED DURING THE COLONOSCOPY:  No aspirin products for 7 days or as instructed.   No alcohol for 7 days or as instructed.   Eat a soft diet for the next 24 hours.  FINDING OUT THE RESULTS OF YOUR TEST Not all test results are available during your visit. If your test results are not back during the visit, make an appointment  with your caregiver to find out the results. Do not assume everything is normal if you have not heard from your caregiver or the medical facility. It is important for you to follow up on all of your test results.  SEEK IMMEDIATE MEDICAL ATTENTION IF:  You have more than a spotting of blood in your stool.   Your belly is swollen (abdominal distention).   You are nauseated or vomiting.   You have a temperature over 101.   You have abdominal pain or discomfort that is severe or gets worse throughout the day.    Colon Diverticulosis information provided  Repeat screening colonoscopy in 10 years   Diverticulosis Diverticulosis is a condition that develops when small pouches (diverticula) form in the wall of the large intestine (colon). The colon is where water is absorbed and stool is formed. The pouches form when the inside layer of the colon pushes through weak spots in the outer layers of the colon. You may have a few pouches or many of them. What are the causes? The cause of this condition is not known. What increases the risk? The following factors may make you more likely to develop this condition:  Being older than age 1. Your risk for this condition increases with age. Diverticulosis is rare among people younger than age 12. By age 86, many people have it.  Eating a low-fiber diet.  Having frequent constipation.  Being overweight.  Not getting enough exercise.  Smoking.  Taking over-the-counter pain medicines, like aspirin and ibuprofen.  Having a family history of diverticulosis.  What are the signs or symptoms? In most people, there are no symptoms of this condition. If you do have symptoms, they may include:  Bloating.  Cramps in the abdomen.  Constipation or diarrhea.  Pain in the lower left side of the abdomen.  How is this diagnosed? This condition is most often diagnosed during an exam for other colon problems. Because diverticulosis usually has no  symptoms, it often cannot be diagnosed independently. This condition may be diagnosed by:  Using a flexible scope to examine the colon (colonoscopy).  Taking an X-ray of the colon after dye has been put into the colon (barium enema).  Doing a CT scan.  How is this treated? You may not need treatment for this condition if you have never developed an infection related to diverticulosis. If you have had an infection before, treatment may include:  Eating a high-fiber diet. This may include eating more fruits, vegetables, and grains.  Taking a fiber supplement.  Taking a live bacteria supplement (probiotic).  Taking medicine to relax your colon.  Taking antibiotic medicines.  Follow these instructions at home:  Drink 6-8 glasses of water or more each day to prevent constipation.  Try not to strain when you have a bowel movement.  If you have had an infection before: ? Eat more fiber as directed by your health care provider or your diet and nutrition specialist (dietitian). ? Take a fiber supplement or probiotic, if your health care provider approves.  Take over-the-counter and prescription medicines only as told by your health care provider.  If you were prescribed an antibiotic, take it as told by your health care provider. Do not stop taking the antibiotic even if you start to feel better.  Keep all follow-up visits as told by your health care provider. This is important. Contact a health care provider if:  You have pain in your abdomen.  You have bloating.  You have cramps.  You have not had a bowel movement in 3 days. Get help right away if:  Your pain gets worse.  Your bloating becomes very bad.  You have a fever or chills, and your symptoms suddenly get worse.  You vomit.  You have bowel movements that are bloody or black.  You have bleeding from your rectum. Summary  Diverticulosis is a condition that develops when small pouches (diverticula) form in the  wall of the large intestine (colon).  You may have a few pouches or many of them.  This condition is most often diagnosed during an exam for other colon problems.  If you have had an infection related to diverticulosis, treatment may include increasing the fiber in your diet, taking supplements, or taking medicines. This information is not intended to replace advice given to you by your health care provider. Make sure you discuss any questions you have with your health care provider. Document Released: 01/22/2004 Document Revised: 03/15/2016 Document Reviewed: 03/15/2016 Elsevier Interactive Patient Education  2017 [employer].

## 2017-03-08 ENCOUNTER — Encounter (HOSPITAL_COMMUNITY): Payer: Self-pay | Admitting: Internal Medicine

## 2017-03-22 DIAGNOSIS — M25561 Pain in right knee: Secondary | ICD-10-CM | POA: Insufficient documentation

## 2017-03-24 ENCOUNTER — Other Ambulatory Visit (HOSPITAL_COMMUNITY): Payer: Self-pay | Admitting: Orthopedic Surgery

## 2017-03-24 DIAGNOSIS — M25561 Pain in right knee: Principal | ICD-10-CM

## 2017-03-24 DIAGNOSIS — M25461 Effusion, right knee: Secondary | ICD-10-CM

## 2017-03-24 DIAGNOSIS — M25361 Other instability, right knee: Secondary | ICD-10-CM

## 2017-03-29 ENCOUNTER — Ambulatory Visit (HOSPITAL_COMMUNITY)
Admission: RE | Admit: 2017-03-29 | Discharge: 2017-03-29 | Disposition: A | Discharge status: Home / Self Care | Payer: PRIVATE HEALTH INSURANCE | Source: Ambulatory Visit | Attending: Orthopedic Surgery | Admitting: Orthopedic Surgery

## 2017-03-29 DIAGNOSIS — S83231A Complex tear of medial meniscus, current injury, right knee, initial encounter: Secondary | ICD-10-CM | POA: Insufficient documentation

## 2017-03-29 DIAGNOSIS — M25561 Pain in right knee: Secondary | ICD-10-CM | POA: Insufficient documentation

## 2017-03-29 DIAGNOSIS — M25461 Effusion, right knee: Secondary | ICD-10-CM | POA: Diagnosis not present

## 2017-03-29 DIAGNOSIS — M1711 Unilateral primary osteoarthritis, right knee: Secondary | ICD-10-CM | POA: Diagnosis not present

## 2017-03-29 DIAGNOSIS — M25361 Other instability, right knee: Secondary | ICD-10-CM | POA: Insufficient documentation

## 2017-03-29 DIAGNOSIS — M7121 Synovial cyst of popliteal space [Baker], right knee: Secondary | ICD-10-CM | POA: Diagnosis not present

## 2017-03-29 DIAGNOSIS — X58XXXA Exposure to other specified factors, initial encounter: Secondary | ICD-10-CM | POA: Insufficient documentation

## 2017-04-15 ENCOUNTER — Telehealth: Payer: Self-pay | Admitting: Family Medicine

## 2017-04-15 NOTE — Telephone Encounter (Signed)
Patient just had knee surgery and he just ran out of his pain medication.  He doesn't want to travel to [place] to get a new Rx for pain medication, so he was told that he could take tylenol.  He is taking 800 mg ibuprofen and he wants to know if its OK for him to take the tylenol with this as well?

## 2017-04-15 NOTE — Telephone Encounter (Signed)
I do not see any history of liver disease. Do not know long in between tylenol can he take ibuprofen. Please advise.

## 2017-04-15 NOTE — Telephone Encounter (Signed)
Patient is aware 

## 2017-04-15 NOTE — Telephone Encounter (Signed)
Max is 500 mg tylenol 6 in a day, no alcohol while on tylenol

## 2017-04-19 DIAGNOSIS — Z9889 Other specified postprocedural states: Secondary | ICD-10-CM | POA: Insufficient documentation

## 2017-04-29 ENCOUNTER — Telehealth: Payer: Self-pay | Admitting: Family Medicine

## 2017-04-29 MED ORDER — METAXALONE 800 MG PO TABS: 800.0000 mg | ORAL_TABLET | Freq: Three times a day (TID) | ORAL | 0 refills | Status: AC

## 2017-04-29 NOTE — Telephone Encounter (Signed)
Prescription sent electronically to pharmacy. Patient notified. 

## 2017-04-29 NOTE — Telephone Encounter (Signed)
Ok may ref

## 2017-04-29 NOTE — Telephone Encounter (Signed)
Patient said he pulled a muscle in his back.  He pulled this same muscle in 03/2013 and Dr. Richardson Landry prescribed metaxalone 800 mg which worked well.  He said it is right above his left kidney in his back area.  He wants to know if we can call this in for him again?  [employer]

## 2017-09-12 ENCOUNTER — Telehealth: Payer: Self-pay | Admitting: *Deleted

## 2017-09-12 NOTE — Telephone Encounter (Signed)
Patient requested a order for his PSA test to be done, patient requested to go tomorrow. Please advise

## 2017-09-12 NOTE — Telephone Encounter (Signed)
ok 

## 2017-09-13 ENCOUNTER — Other Ambulatory Visit: Payer: Self-pay | Admitting: Family Medicine

## 2017-09-13 DIAGNOSIS — R972 Elevated prostate specific antigen [PSA]: Secondary | ICD-10-CM

## 2017-09-13 NOTE — Telephone Encounter (Signed)
Labs placed and pt notified

## 2017-09-15 LAB — PSA: PROSTATE SPECIFIC AG, SERUM: 5.1 ng/mL — AB (ref 0.0–4.0)

## 2017-10-25 ENCOUNTER — Encounter: Payer: Self-pay | Admitting: Family Medicine

## 2017-10-25 ENCOUNTER — Ambulatory Visit (INDEPENDENT_AMBULATORY_CARE_PROVIDER_SITE_OTHER): Payer: PRIVATE HEALTH INSURANCE | Admitting: Family Medicine

## 2017-10-25 VITALS — BP 130/78 | Ht 67.5 in | Wt 174.6 lb

## 2017-10-25 DIAGNOSIS — Z0000 Encounter for general adult medical examination without abnormal findings: Secondary | ICD-10-CM | POA: Diagnosis not present

## 2017-10-25 MED ORDER — ZOSTER VAC RECOMB ADJUVANTED 50 MCG/0.5ML IM SUSR: 0.5000 mL | Freq: Once | INTRAMUSCULAR | 1 refills | Status: AC

## 2017-10-25 NOTE — Progress Notes (Signed)
   Subjective:    Patient ID: Victor Patel, male    DOB: 1955/07/04, 62 y.o.   MRN: [id number]  HPI The patient comes in today for a wellness visit.    A review of their health history was completed.  A review of medications was also completed.  Any needed refills; yes  Eating habits: eating healthy  Falls/  MVA accidents in past few months: none  Regular exercise: every day  Specialist pt sees on regular basis: yes-urology  Preventative health issues were discussed.   Additional concerns: none  Reg exercise all the time very reg  No colon no prost cancer  stys very active, pushes the yard   Saw the urologist. Saw eskridge, did an exam, and he rec repeat the blood work,  Numbers overall lowr but still elevated, had some constipation     Review of Systems  Constitutional: Negative for activity change, appetite change and fever.  HENT: Negative for congestion and rhinorrhea.   Eyes: Negative for discharge.  Respiratory: Negative for cough and wheezing.   Cardiovascular: Negative for chest pain.  Gastrointestinal: Negative for abdominal pain, blood in stool and vomiting.  Genitourinary: Negative for difficulty urinating and frequency.  Musculoskeletal: Negative for neck pain.  Skin: Negative for rash.  Allergic/Immunologic: Negative for environmental allergies and food allergies.  Neurological: Negative for weakness and headaches.  Psychiatric/Behavioral: Negative for agitation.  All other systems reviewed and are negative.      Objective:   Physical Exam  Constitutional: He appears well-developed and well-nourished.  HENT:  Head: Normocephalic and atraumatic.  Right Ear: External ear normal.  Left Ear: External ear normal.  Nose: Nose normal.  Mouth/Throat: Oropharynx is clear and moist.  Eyes: Pupils are equal, round, and reactive to light. EOM are normal.  Neck: Normal range of motion. Neck supple. No thyromegaly present.  Cardiovascular: Normal rate,  regular rhythm and normal heart sounds.  No murmur heard. Pulmonary/Chest: Effort normal and breath sounds normal. No respiratory distress. He has no wheezes.  Abdominal: Soft. Bowel sounds are normal. He exhibits no distension and no mass. There is no tenderness.  Genitourinary: Penis normal.  Musculoskeletal: Normal range of motion. He exhibits no edema.  Lymphadenopathy:    He has no cervical adenopathy.  Neurological: He is alert. He exhibits normal muscle tone.  Skin: Skin is warm and dry. No erythema.  Psychiatric: He has a normal mood and affect. His behavior is normal. Judgment normal.  Vitals reviewed.         Assessment & Plan:  Colonoscopy 2018/impression wellness exam.  Up-to-date on colonoscopy.  Diet discussed.  Exercise discussed.  Blood work discussed.  Patient followed by urologist for elevated PSA.  Anticipatory guidance given.  Vaccines discussed.  Shingrix vaccine recommended

## 2018-03-09 MED ORDER — LACTATED RINGERS IV SOLN
INTRAVENOUS | Status: DC
Start: ? — End: 2018-03-09

## 2018-03-09 MED ORDER — ACETAMINOPHEN 325 MG PO TABS
975.00 | ORAL_TABLET | ORAL | Status: DC
Start: 2018-03-09 — End: 2018-03-09

## 2018-03-09 MED ORDER — ONDANSETRON HCL 4 MG/2ML IJ SOLN
4.00 | INTRAMUSCULAR | Status: DC
Start: ? — End: 2018-03-09

## 2018-03-09 MED ORDER — BELLADONNA ALKALOIDS-OPIUM 16.2-30 MG RE SUPP
1.00 | RECTAL | Status: DC
Start: ? — End: 2018-03-09

## 2018-03-09 MED ORDER — BACITRACIN 500 UNIT/GM EX OINT
TOPICAL_OINTMENT | CUTANEOUS | Status: DC
Start: 2018-03-09 — End: 2018-03-09

## 2018-03-09 MED ORDER — CELECOXIB 100 MG PO CAPS
100.00 | ORAL_CAPSULE | ORAL | Status: DC
Start: 2018-03-09 — End: 2018-03-09

## 2018-03-09 MED ORDER — OXYBUTYNIN CHLORIDE 5 MG PO TABS
5.00 | ORAL_TABLET | ORAL | Status: DC
Start: ? — End: 2018-03-09

## 2018-03-09 MED ORDER — GABAPENTIN 100 MG PO CAPS
100.00 | ORAL_CAPSULE | ORAL | Status: DC
Start: 2018-03-09 — End: 2018-03-09

## 2018-03-09 MED ORDER — OXYCODONE HCL 5 MG PO TABS
5.00 | ORAL_TABLET | ORAL | Status: DC
Start: ? — End: 2018-03-09

## 2018-03-09 MED ORDER — MENTHOL 7.6 MG MT LOZG
1.00 | LOZENGE | OROMUCOSAL | Status: DC
Start: ? — End: 2018-03-09

## 2018-03-09 MED ORDER — LIDOCAINE 5 % EX PTCH
1.00 | MEDICATED_PATCH | CUTANEOUS | Status: DC
Start: 2018-03-09 — End: 2018-03-09

## 2018-03-09 MED ORDER — HYDROMORPHONE HCL 1 MG/ML IJ SOLN
0.50 | INTRAMUSCULAR | Status: DC
Start: ? — End: 2018-03-09

## 2018-03-09 MED ORDER — POLYETHYLENE GLYCOL 3350 17 G PO PACK
17.00 | PACK | ORAL | Status: DC
Start: 2018-03-10 — End: 2018-03-09

## 2018-03-09 MED ORDER — ENOXAPARIN SODIUM 40 MG/0.4ML ~~LOC~~ SOLN
40.00 | SUBCUTANEOUS | Status: DC
Start: 2018-03-10 — End: 2018-03-09

## 2018-03-09 MED ORDER — GENERIC EXTERNAL MEDICATION
Status: DC
Start: ? — End: 2018-03-09

## 2018-03-09 MED ORDER — SENNOSIDES-DOCUSATE SODIUM 8.6-50 MG PO TABS
2.00 | ORAL_TABLET | ORAL | Status: DC
Start: 2018-03-09 — End: 2018-03-09

## 2018-03-22 DIAGNOSIS — N3946 Mixed incontinence: Secondary | ICD-10-CM | POA: Insufficient documentation

## 2018-03-22 DIAGNOSIS — N5203 Combined arterial insufficiency and corporo-venous occlusive erectile dysfunction: Secondary | ICD-10-CM | POA: Insufficient documentation

## 2018-04-15 ENCOUNTER — Other Ambulatory Visit: Payer: Self-pay | Admitting: Family Medicine

## 2018-04-17 ENCOUNTER — Other Ambulatory Visit: Payer: Self-pay | Admitting: Family Medicine

## 2018-04-17 MED ORDER — PREDNISONE 20 MG PO TABS: ORAL_TABLET | ORAL | 0 refills | Status: DC

## 2018-04-17 NOTE — Telephone Encounter (Signed)
See phone mess just ordered adult pred taper

## 2018-04-17 NOTE — Telephone Encounter (Signed)
See ohione mess just ordered in adult pred taper

## 2018-04-17 NOTE — Telephone Encounter (Signed)
Pt would like to have the medication that Dr. Richardson Landry calls in when he has inflammation in his big toe sent to [place], [place].   CB#[phone]

## 2018-04-17 NOTE — Telephone Encounter (Addendum)
Patient was given rx for prednisone taper for this in 2017

## 2018-04-17 NOTE — Telephone Encounter (Signed)
Prescription sent electronically to pharmacy. Patient notified. 

## 2018-04-17 NOTE — Telephone Encounter (Signed)
Rep same adult pred taper

## 2018-08-04 ENCOUNTER — Other Ambulatory Visit: Payer: Self-pay

## 2018-08-04 ENCOUNTER — Other Ambulatory Visit (INDEPENDENT_AMBULATORY_CARE_PROVIDER_SITE_OTHER): Payer: PRIVATE HEALTH INSURANCE | Admitting: Family Medicine

## 2018-08-04 ENCOUNTER — Telehealth: Payer: Self-pay | Admitting: Family Medicine

## 2018-08-04 DIAGNOSIS — M79671 Pain in right foot: Secondary | ICD-10-CM

## 2018-08-04 MED ORDER — PREDNISONE 20 MG PO TABS: ORAL_TABLET | ORAL | 0 refills | Status: DC

## 2018-08-04 MED ORDER — DICLOFENAC SODIUM 75 MG PO TBEC: DELAYED_RELEASE_TABLET | ORAL | 0 refills | Status: DC

## 2018-08-04 NOTE — Progress Notes (Signed)
   Subjective:    Patient ID: Victor Patel, male    DOB: May 01, 1956, 63 y.o.   MRN: [id number]  HPI Virtual Visit via Telephone Note  I connected with Victor Patel on 08/04/18 at  2:30 PM EDT by telephone and verified that I am speaking with the correct person using two identifiers.   I discussed the limitations, risks, security and privacy concerns of performing an evaluation and management service by telephone and the availability of in person appointments. I also discussed with the patient that there may be a patient responsible charge related to this service. The patient expressed understanding and agreed to proceed.   History of Present Illness:    Observations/Objective:   Assessment and Plan:   Follow Up Instructions:    I discussed the assessment and treatment plan with the patient. The patient was provided an opportunity to ask questions and all were answered. The patient agreed with the plan and demonstrated an understanding of the instructions.   The patient was advised to call back or seek an in-person evaluation if the symptoms worsen or if the condition fails to improve as anticipated.  I provided 5 minutes of non-face-to-face time during this encounter.   Vicente Males, LPN    Review of Systems     Objective:   Physical Exam        Assessment & Plan:

## 2018-08-04 NOTE — Telephone Encounter (Signed)
Patient states he has had problems with stressing the right great toe. He states this has been been painful and swollen for the last four days.   He states we have sent in prednisone and another medication he could not remember the name of for him in the past.  He states he does not have gout. Please advise.

## 2018-08-04 NOTE — Telephone Encounter (Signed)
aadult pred taper, diclofenac 75 28 bid with food in future  Go ahead and charge tele fee  Nurses know how to do that?

## 2018-08-04 NOTE — Telephone Encounter (Signed)
Patient requesting antiinflammatory medication prescribed in past for joint pain in (R) foot. No other symptoms. Advise.   Pharmacy:  [place], [place]

## 2018-08-04 NOTE — Telephone Encounter (Signed)
Medication sent in and pt is aware. Pt is aware that this will be charged as a telephone message. Pt verbalized understanding.

## 2019-01-17 ENCOUNTER — Telehealth: Payer: Self-pay | Admitting: Family Medicine

## 2019-01-17 DIAGNOSIS — Z131 Encounter for screening for diabetes mellitus: Secondary | ICD-10-CM

## 2019-01-17 DIAGNOSIS — Z13220 Encounter for screening for lipoid disorders: Secondary | ICD-10-CM

## 2019-01-17 NOTE — Telephone Encounter (Signed)
Last screening labs scanned in 09/2015

## 2019-01-17 NOTE — Telephone Encounter (Signed)
Patient wanting labs done for physical 9/28. He states doesn't need PSA check he had that done at [facility] on 9/8

## 2019-01-17 NOTE — Telephone Encounter (Signed)
Lip liv m7 

## 2019-01-18 NOTE — Telephone Encounter (Signed)
Blood work ordered in Epic. Patient notified. 

## 2019-01-26 LAB — BASIC METABOLIC PANEL
BUN/Creatinine Ratio: 12 (ref 10–24)
BUN: 13 mg/dL (ref 8–27)
CO2: 24 mmol/L (ref 20–29)
Calcium: 9.3 mg/dL (ref 8.6–10.2)
Chloride: 104 mmol/L (ref 96–106)
Creatinine, Ser: 1.08 mg/dL (ref 0.76–1.27)
GFR calc Af Amer: 85 mL/min/{1.73_m2} (ref >59)
GFR calc non Af Amer: 73 mL/min/{1.73_m2} (ref >59)
Glucose: 101 mg/dL — ABNORMAL HIGH (ref 65–99)
Potassium: 4.3 mmol/L (ref 3.5–5.2)
Sodium: 143 mmol/L (ref 134–144)

## 2019-01-26 LAB — LIPID PANEL
Chol/HDL Ratio: 4.3 ratio (ref 0.0–5.0)
Cholesterol, Total: 230 mg/dL — ABNORMAL HIGH (ref 100–199)
HDL: 54 mg/dL (ref >39)
LDL Chol Calc (NIH): 160 mg/dL — ABNORMAL HIGH (ref 0–99)
Triglycerides: 92 mg/dL (ref 0–149)
VLDL Cholesterol Cal: 16 mg/dL (ref 5–40)

## 2019-01-26 LAB — HEPATIC FUNCTION PANEL
ALT: 19 IU/L (ref 0–44)
AST: 26 IU/L (ref 0–40)
Albumin: 4.5 g/dL (ref 3.8–4.8)
Alkaline Phosphatase: 66 IU/L (ref 39–117)
Bilirubin Total: 0.9 mg/dL (ref 0.0–1.2)
Bilirubin, Direct: 0.22 mg/dL (ref 0.00–0.40)
Total Protein: 6.8 g/dL (ref 6.0–8.5)

## 2019-02-05 ENCOUNTER — Encounter: Payer: Self-pay | Admitting: Family Medicine

## 2019-02-05 ENCOUNTER — Other Ambulatory Visit: Payer: Self-pay

## 2019-02-05 ENCOUNTER — Ambulatory Visit (INDEPENDENT_AMBULATORY_CARE_PROVIDER_SITE_OTHER): Payer: PRIVATE HEALTH INSURANCE | Admitting: Family Medicine

## 2019-02-05 VITALS — BP 128/86 | Temp 98.0°F | Ht 67.5 in | Wt 182.2 lb

## 2019-02-05 DIAGNOSIS — C61 Malignant neoplasm of prostate: Secondary | ICD-10-CM | POA: Diagnosis not present

## 2019-02-05 DIAGNOSIS — Z0000 Encounter for general adult medical examination without abnormal findings: Secondary | ICD-10-CM

## 2019-02-05 MED ORDER — SHINGRIX 50 MCG/0.5ML IM SUSR: 0.5000 mL | Freq: Once | INTRAMUSCULAR | 1 refills | Status: AC

## 2019-02-05 NOTE — Patient Instructions (Signed)
Results for orders placed or performed in visit on 01/17/19  Lipid panel  Result Value Ref Range   Cholesterol, Total 230 (H) 100 - 199 mg/dL   Triglycerides 92 0 - 149 mg/dL   HDL 54 >39 mg/dL   VLDL Cholesterol Cal 16 5 - 40 mg/dL   LDL Chol Calc (NIH) 160 (H) 0 - 99 mg/dL   Chol/HDL Ratio 4.3 0.0 - 5.0 ratio  Hepatic function panel  Result Value Ref Range   Total Protein 6.8 6.0 - 8.5 g/dL   Albumin 4.5 3.8 - 4.8 g/dL   Bilirubin Total 0.9 0.0 - 1.2 mg/dL   Bilirubin, Direct 0.22 0.00 - 0.40 mg/dL   Alkaline Phosphatase 66 39 - 117 IU/L   AST 26 0 - 40 IU/L   ALT 19 0 - 44 IU/L  Basic metabolic panel  Result Value Ref Range   Glucose 101 (H) 65 - 99 mg/dL   BUN 13 8 - 27 mg/dL   Creatinine, Ser 1.08 0.76 - 1.27 mg/dL   GFR calc non Af Amer 73 >59 mL/min/1.73   GFR calc Af Amer 85 >59 mL/min/1.73   BUN/Creatinine Ratio 12 10 - 24   Sodium 143 134 - 144 mmol/L   Potassium 4.3 3.5 - 5.2 mmol/L   Chloride 104 96 - 106 mmol/L   CO2 24 20 - 29 mmol/L   Calcium 9.3 8.6 - 10.2 mg/dL

## 2019-02-05 NOTE — Progress Notes (Signed)
Subjective:    Patient ID: Victor Patel, male    DOB: [birth year], 63 y.o.   MRN: [id number]  HPI  The patient comes in today for a wellness visit.    A review of their health history was completed.  A review of medications was also completed.  Any needed refills; yes  Eating habits: eats good- grazes  Falls/  MVA accidents in past few months: none  Regular exercise: all the time  Specialist pt sees on regular basis: oncologist  Preventative health issues were discussed.   Additional concerns: none Results for orders placed or performed in visit on 01/17/19  Lipid panel  Result Value Ref Range   Cholesterol, Total 230 (H) 100 - 199 mg/dL   Triglycerides 92 0 - 149 mg/dL   HDL 54 >39 mg/dL   VLDL Cholesterol Cal 16 5 - 40 mg/dL   LDL Chol Calc (NIH) 160 (H) 0 - 99 mg/dL   Chol/HDL Ratio 4.3 0.0 - 5.0 ratio  Hepatic function panel  Result Value Ref Range   Total Protein 6.8 6.0 - 8.5 g/dL   Albumin 4.5 3.8 - 4.8 g/dL   Bilirubin Total 0.9 0.0 - 1.2 mg/dL   Bilirubin, Direct 0.22 0.00 - 0.40 mg/dL   Alkaline Phosphatase 66 39 - 117 IU/L   AST 26 0 - 40 IU/L   ALT 19 0 - 44 IU/L  Basic metabolic panel  Result Value Ref Range   Glucose 101 (H) 65 - 99 mg/dL   BUN 13 8 - 27 mg/dL   Creatinine, Ser 1.08 0.76 - 1.27 mg/dL   GFR calc non Af Amer 73 >59 mL/min/1.73   GFR calc Af Amer 85 >59 mL/min/1.73   BUN/Creatinine Ratio 12 10 - 24   Sodium 143 134 - 144 mmol/L   Potassium 4.3 3.5 - 5.2 mmol/L   Chloride 104 96 - 106 mmol/L   CO2 24 20 - 29 mmol/L   Calcium 9.3 8.6 - 10.2 mg/dL   Handling prost cancer dx 2ell  Exercising well  Working out a lot   Review of Systems  Constitutional: Negative for activity change, appetite change and fever.  HENT: Negative for congestion and rhinorrhea.   Eyes: Negative for discharge.  Respiratory: Negative for cough and wheezing.   Cardiovascular: Negative for chest pain.  Gastrointestinal: Negative for abdominal pain,  blood in stool and vomiting.  Genitourinary: Negative for difficulty urinating and frequency.  Musculoskeletal: Negative for neck pain.  Skin: Negative for rash.  Allergic/Immunologic: Negative for environmental allergies and food allergies.  Neurological: Negative for weakness and headaches.  Psychiatric/Behavioral: Negative for agitation.  All other systems reviewed and are negative.  No headache, no major weight loss or weight gain, no chest pain no back pain abdominal pain no change in bowel habits complete ROS otherwise negative     Objective:   Physical Exam Vitals signs reviewed.  Constitutional:      Appearance: He is well-developed.  HENT:     Head: Normocephalic and atraumatic.     Right Ear: External ear normal.     Left Ear: External ear normal.     Nose: Nose normal.  Eyes:     Pupils: Pupils are equal, round, and reactive to light.  Neck:     Musculoskeletal: Normal range of motion and neck supple.     Thyroid: No thyromegaly.  Cardiovascular:     Rate and Rhythm: Normal rate and regular rhythm.     Heart sounds:  Normal heart sounds. No murmur.  Pulmonary:     Effort: Pulmonary effort is normal. No respiratory distress.     Breath sounds: Normal breath sounds. No wheezing.  Abdominal:     General: Bowel sounds are normal. There is no distension.     Palpations: Abdomen is soft. There is no mass.     Tenderness: There is no abdominal tenderness.  Genitourinary:    Penis: Normal.   Musculoskeletal: Normal range of motion.  Lymphadenopathy:     Cervical: No cervical adenopathy.  Skin:    General: Skin is warm and dry.     Findings: No erythema.  Neurological:     Mental Status: He is alert.     Motor: No abnormal muscle tone.  Psychiatric:        Behavior: Behavior normal.        Judgment: Judgment normal.           Assessment & Plan:  Impression wellness exam.  Colon done 2 years ago.  One not due for 8 years.  Diet discussed.  Exercise  discussed.  Flu shot last week.  Tdap 2 years ago.  Shingles vaccine recommended and prescription given  #2 prostate cancer.  Currently covered by specialist.  Patient did have a prostate excision.  States tolerating relatively well  Blood work reviewed.  Hyperlipidemia LDL too high.  Patient eats a lot of pizza will work on this along with exercise not enough for medication yet

## 2019-04-10 ENCOUNTER — Other Ambulatory Visit: Payer: Self-pay

## 2019-04-10 DIAGNOSIS — Z20822 Contact with and (suspected) exposure to covid-19: Secondary | ICD-10-CM

## 2019-04-12 LAB — NOVEL CORONAVIRUS, NAA: SARS-CoV-2, NAA: NOT DETECTED

## 2019-04-17 ENCOUNTER — Other Ambulatory Visit: Payer: Self-pay

## 2019-04-17 DIAGNOSIS — Z20822 Contact with and (suspected) exposure to covid-19: Secondary | ICD-10-CM

## 2019-04-19 LAB — NOVEL CORONAVIRUS, NAA: SARS-CoV-2, NAA: NOT DETECTED

## 2019-04-24 IMAGING — MR MR KNEE*R* W/O CM
4 of 6 series · 13 of 40 positions shown · non-contrast
Comparison: None.

CLINICAL DATA: Diffuse right knee pain since a twisting injury 1
month ago.

EXAM:
MRI OF THE RIGHT KNEE WITHOUT CONTRAST
TECHNIQUE: Multiplanar, multisequence MR imaging of the knee was performed. No
intravenous contrast was administered.

[Series 3: pdfs axial · axial · 3.0mm · 0.20mm/px · z∈[-46,+26]mm · 3 of 30 slices shown]
[im 5/30]
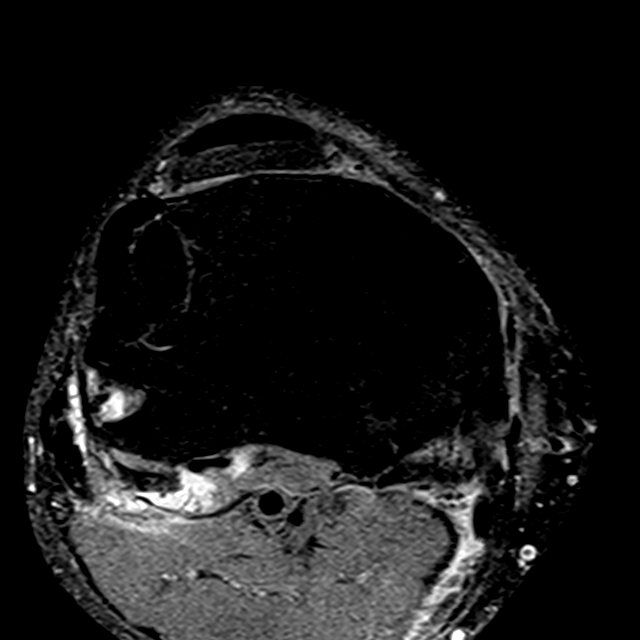
[im 15/30]
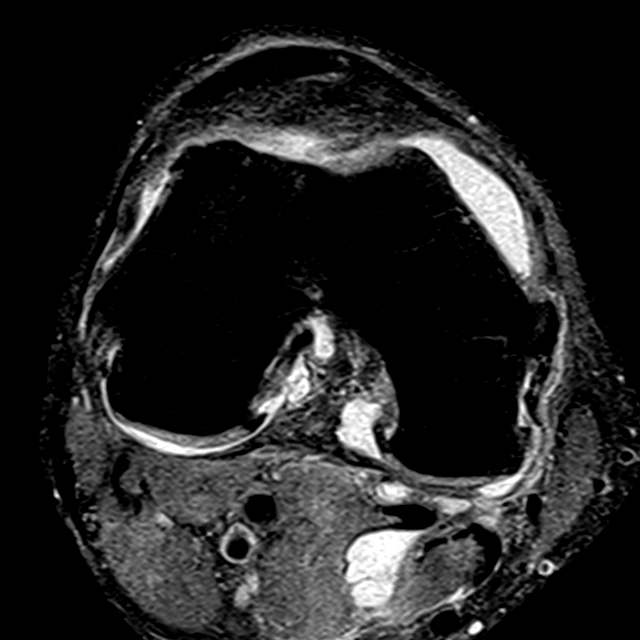
[im 25/30]
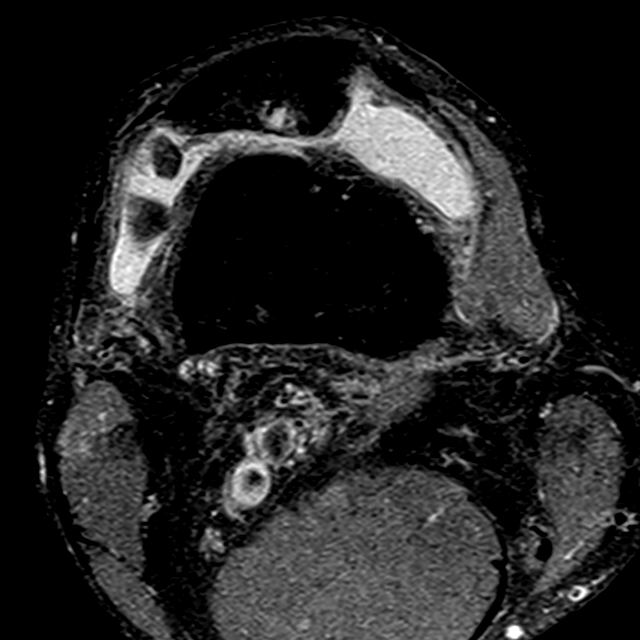

[Series 4: T1 · coronal · 3.0mm · 0.20mm/px · 4 of 26 slices shown]
[im 1/26]
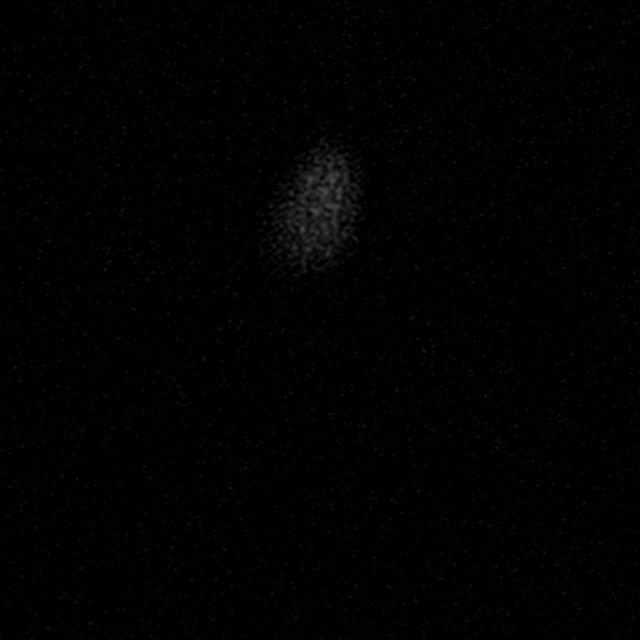
[im 5/26]
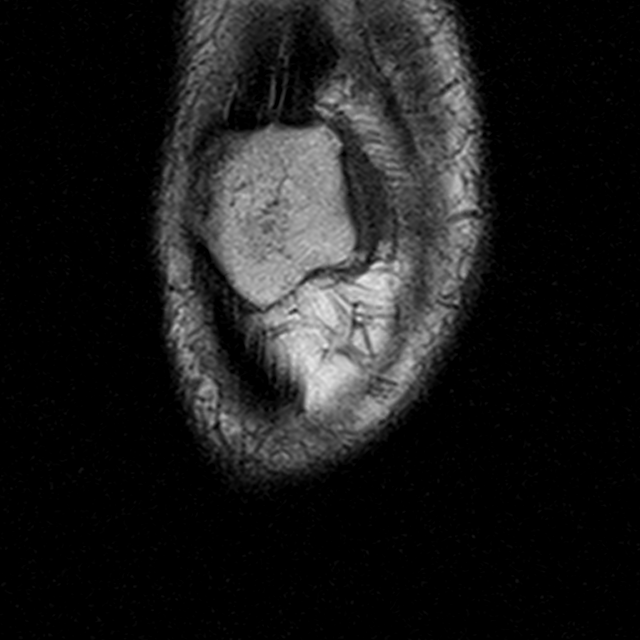
[im 13/26]
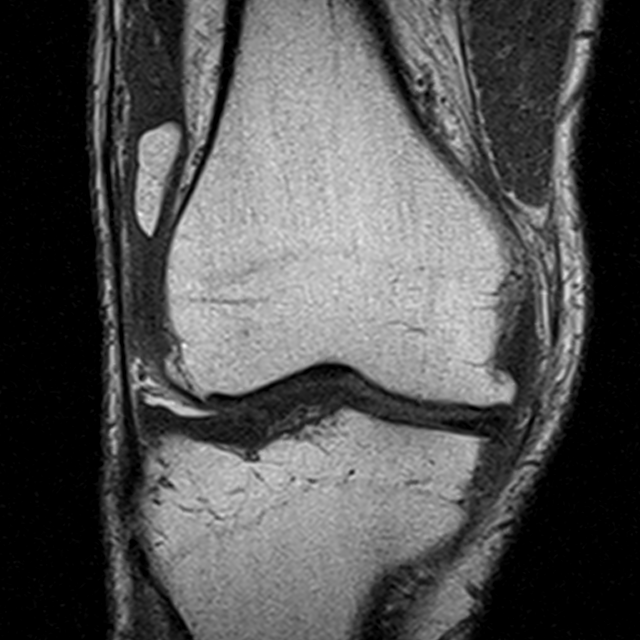
[im 21/26]
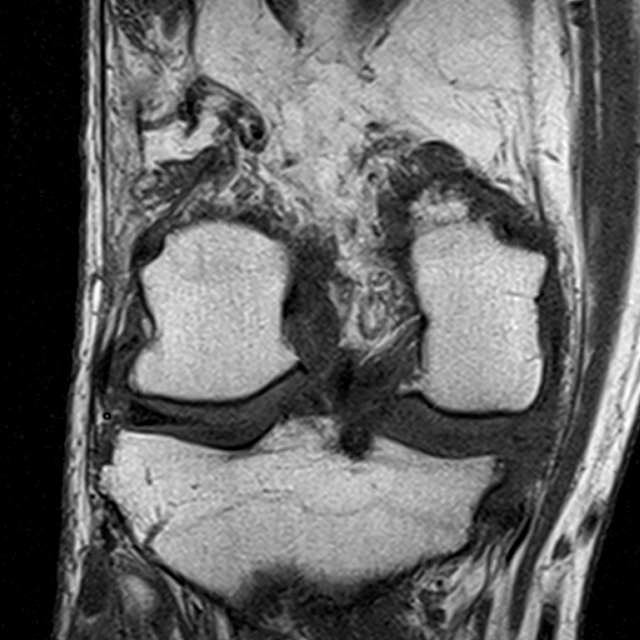

[Series 5: pdfs sag · sagittal · 3.0mm · 0.20mm/px · 3 of 29 slices shown]
[im 5/29]
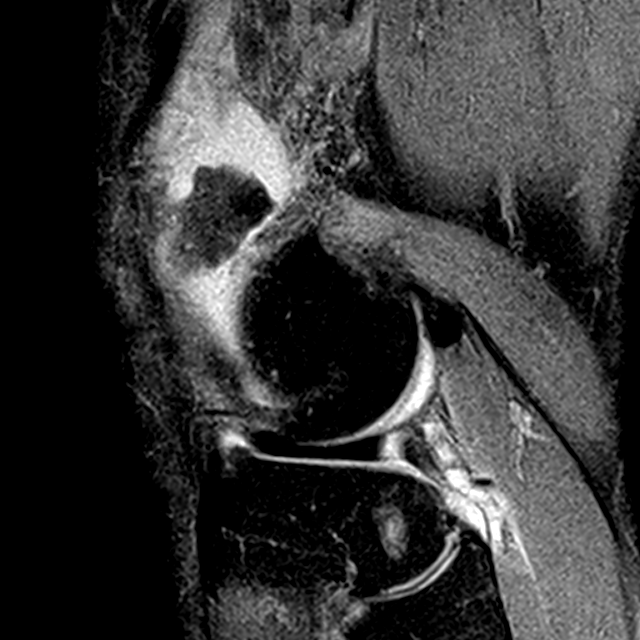
[im 17/29]
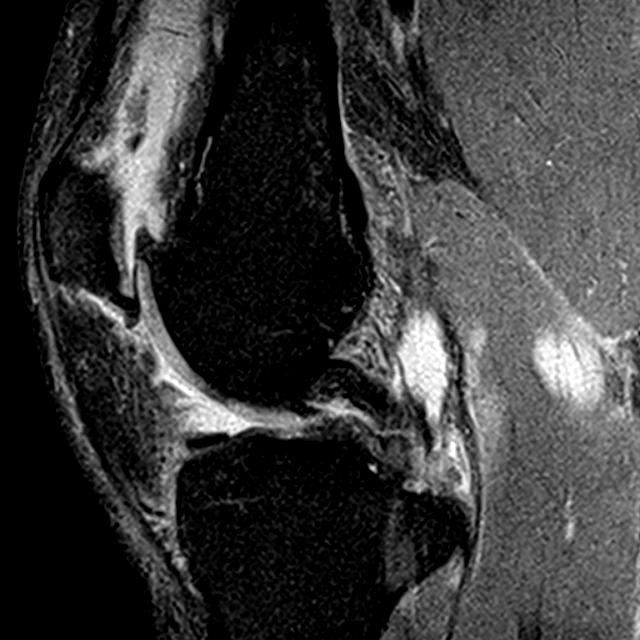
[im 25/29]
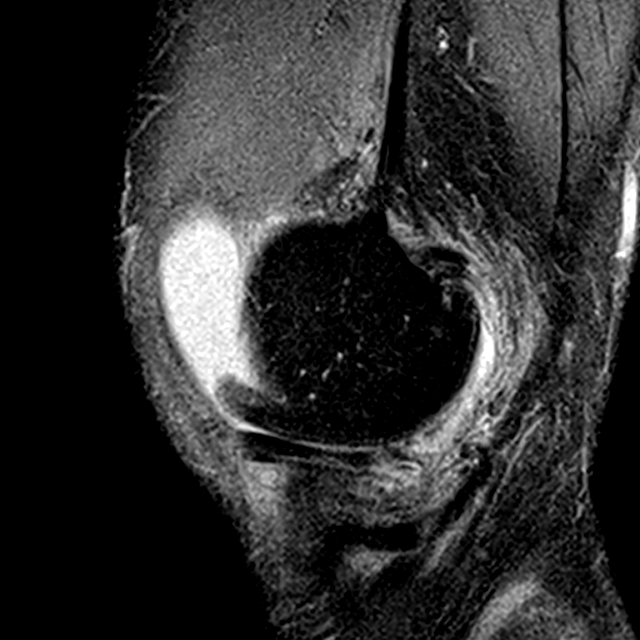

[Series 6: t2fs cor · coronal · 3.0mm · 0.20mm/px · 3 of 26 slices shown]
[im 5/26]
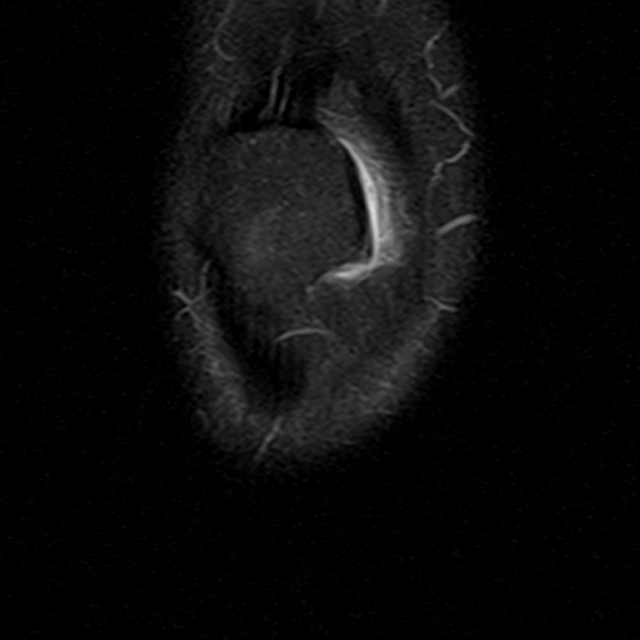
[im 13/26]
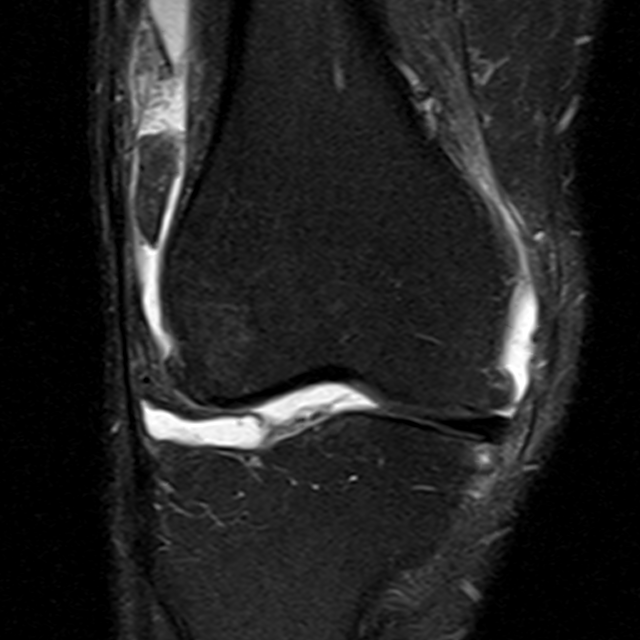
[im 21/26]
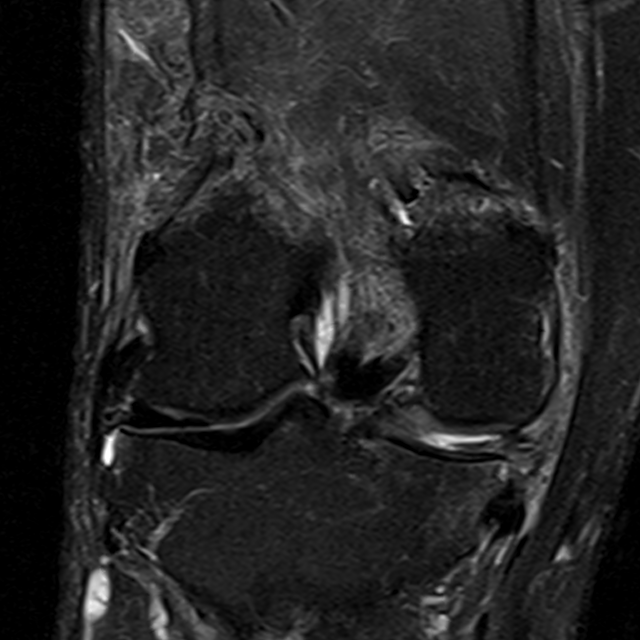

[13 of 40 positions shown; findings below may reference images not displayed]

FINDINGS: MENISCI

Medial meniscus: Complex tearing is seen throughout the posterior
horn. The tear assumes a horizontal orientation in the posterior
body.

Lateral meniscus:  Intact.

LIGAMENTS

Cruciates:  Intact.

Collaterals:  Intact.

CARTILAGE

Patellofemoral: Cartilage along the medial femoral trochlea and
medial patellar facet is completely denuded.

Medial:  Markedly thinned and irregular throughout.

Lateral:  Moderately degenerated throughout.

Joint: Moderate joint effusion is present. A loose body off the
superior pole of the patella measures 1.3 cm craniocaudal x 0.6 cm
AP x 2.2 cm transverse. A second loose body along the lateral
femoral condyles measures 2.5 cm AP x 0.8 cm transverse x 2.1 cm
craniocaudal.

Popliteal Fossa: The patient has a Baker's cyst measuring
approximately 2.1 cm transverse x 1.8 cm AP x 4.8 cm craniocaudal.

Extensor Mechanism:  Intact.

Bones: No fracture or worrisome lesion. Tricompartmental
osteophytosis is identified.

Other: The patient has degenerative disease about the proximal
tib-fib joint with subchondral cyst formation and osteophytosis
present. Loose body off the posterior aspect of the joint measures
0.5 cm.
IMPRESSION: Dominant finding is advanced osteoarthritis about the knee appearing
worst in medial and patellofemoral compartments. Two large loose
bodies in the patellofemoral compartment are identified as described
above.

Complex degenerative tearing posterior horn medial meniscus assumes
a horizontal orientation in the posterior aspect of the meniscal
body.

Moderately severe appearing osteoarthritis proximal tib-fib joint
with a 0.6 cm loose body seen off the posterior aspect of the joint.

Baker's cyst.

## 2019-06-14 ENCOUNTER — Encounter: Payer: Self-pay | Admitting: Family Medicine

## 2019-07-27 ENCOUNTER — Ambulatory Visit: Payer: PRIVATE HEALTH INSURANCE | Attending: Internal Medicine

## 2019-07-27 DIAGNOSIS — Z23 Encounter for immunization: Secondary | ICD-10-CM

## 2019-07-27 NOTE — Progress Notes (Signed)
   Covid-19 Vaccination Clinic  Name:  Victor Patel    MRN: [id number] DOB: 1956-03-07  07/27/2019  [name] was observed post Covid-19 immunization for 15 minutes without incident. He was provided with Vaccine Information Sheet and instruction to access the V-Safe system.   [name] was instructed to call 911 with any severe reactions post vaccine: Marland Kitchen Difficulty breathing  . Swelling of face and throat  . A fast heartbeat  . A bad rash all over body  . Dizziness and weakness   Immunizations Administered    Name Date Dose VIS Date Route   Moderna COVID-19 Vaccine 07/27/2019  8:21 AM 0.5 mL 04/10/2019 Intramuscular   Manufacturer: Moderna   Lot: [id number]   [id number]

## 2019-08-29 ENCOUNTER — Ambulatory Visit: Payer: PRIVATE HEALTH INSURANCE | Attending: Internal Medicine

## 2019-08-29 DIAGNOSIS — Z23 Encounter for immunization: Secondary | ICD-10-CM

## 2019-08-29 NOTE — Progress Notes (Signed)
   Covid-19 Vaccination Clinic  Name:  Victor Patel    MRN: [id number] DOB: 13-Sep-1955  08/29/2019  Victor Patel was observed post Covid-19 immunization for 15 minutes without incident. He was provided with Vaccine Information Sheet and instruction to access the V-Safe system.   Victor Patel was instructed to call 911 with any severe reactions post vaccine: Marland Kitchen Difficulty breathing  . Swelling of face and throat  . A fast heartbeat  . A bad rash all over body  . Dizziness and weakness   Immunizations Administered    Name Date Dose VIS Date Route   Moderna COVID-19 Vaccine 08/29/2019  8:16 AM 0.5 mL 04/2019 Intramuscular   Manufacturer: Moderna   Lot: [id number]   [id number]

## 2019-09-06 ENCOUNTER — Telehealth: Payer: Self-pay | Admitting: Family Medicine

## 2019-09-07 ENCOUNTER — Ambulatory Visit (INDEPENDENT_AMBULATORY_CARE_PROVIDER_SITE_OTHER): Payer: PRIVATE HEALTH INSURANCE | Admitting: Family Medicine

## 2019-09-07 ENCOUNTER — Encounter: Payer: Self-pay | Admitting: Family Medicine

## 2019-09-07 ENCOUNTER — Other Ambulatory Visit: Payer: Self-pay

## 2019-09-07 VITALS — BP 124/82 | HR 84 | Temp 98.1°F | Ht 67.5 in | Wt 185.6 lb

## 2019-09-07 DIAGNOSIS — M79674 Pain in right toe(s): Secondary | ICD-10-CM

## 2019-09-07 MED ORDER — DICLOFENAC SODIUM 75 MG PO TBEC: 75.0000 mg | DELAYED_RELEASE_TABLET | Freq: Two times a day (BID) | ORAL | 0 refills | Status: DC

## 2019-09-07 MED ORDER — PREDNISONE 20 MG PO TABS: ORAL_TABLET | ORAL | 1 refills | Status: DC

## 2019-09-07 NOTE — Progress Notes (Addendum)
Patient ID: Victor Patel, male    DOB: 13-Oct-1955, 64 y.o.   MRN: [id number]   Chief Complaint  Patient presents with  . inflamation in right great toe   Subjective:   HPI  Started having rt great toe pain.  No redness.  Mild swelling. Has h/o bunion and arthritis in the joint.  Was worked up in past for gout, was negative.  Happens occasionally with this pain about 1-2 per yr. Started years ago.   Seen by Dr. Linus Mako- chiropractor. Past history left knee pain. Was told had Rt hip out of alignment. Tried to realign the hip and things improved with the foot/toe. Gets flared up when standing on ladder long time or jumping onto the foot. Using Ibuprofen and ice, then has to come in when it's not improving.  Has had prednisone and voltaren in past for the pain.  Had prostate surgery for prostate cancer and feeling well.  Seeing Surgeon at [facility] every 6 months.  In 9/21 this year-  Carrying a back pack and walking a lot for a upcoming trip.  Training for it this summer. Going to [facility]. Back packing isolated area and hiking into area w/o medical treatment for miles and only able to get helicopter in if needing medical tx.   wanting to get back to training.  Wanting to get something in case if toe flares up while out on the trip.  Medical History Sabastian has a past medical history of Hyperlipidemia and Hypertension.   Outpatient Encounter Medications as of 09/07/2019  Medication Sig  . aspirin EC 81 MG tablet Take 81 mg by mouth daily. (Patient not taking: Reported on 03/19/2020)  . diclofenac (VOLTAREN) 75 MG EC tablet Take 1 tablet (75 mg total) by mouth 2 (two) times daily. Prn pain. (Patient not taking: Reported on 03/19/2020)  . ibuprofen (ADVIL,MOTRIN) 200 MG tablet Take 400 mg by mouth every 8 (eight) hours as needed (for pain.). (Patient not taking: Reported on 03/19/2020)  . predniSONE (DELTASONE) 20 MG tablet Take 3 tab p.o. x3 days, then 2 tab p.o. x3 days, then 1  tab p.o. x3 days. Take for toe pain. (Patient not taking: Reported on 03/19/2020)  . pseudoephedrine-guaifenesin (MUCINEX D) 60-600 MG 12 hr tablet Take 1 tablet by mouth every 12 (twelve) hours as needed (for nasal congestion.).  (Patient not taking: Reported on 03/19/2020)  . Soft Lens Products (REWETTING DROPS) SOLN Apply 1-2 drops to eye daily as needed (for contatct lenses.). (Patient not taking: Reported on 03/19/2020)  . [DISCONTINUED] diclofenac (VOLTAREN) 75 MG EC tablet Take one tablet by mouth twice daily. Please take with food  . [DISCONTINUED] fluticasone (FLONASE) 50 MCG/ACT nasal spray Place 2 sprays into both nostrils daily. (Patient taking differently: Place 2 sprays into both nostrils daily as needed (for allergies.). )  . [DISCONTINUED] predniSONE (DELTASONE) 20 MG tablet Take three tablets by mouth for 3 days, then take 2 tablet by mouth for 3 days, then take one tablet by mouth for 3 days   No facility-administered encounter medications on file as of 09/07/2019.     Review of Systems  Constitutional: Negative.   Musculoskeletal:       +rt great toe pain  Skin: Negative.      Vitals BP 124/82   Pulse 84   Temp 98.1 F (36.7 C) (Oral)   Ht 5' 7.5" (1.715 m)   Wt 185 lb 9.6 oz (84.2 kg)   SpO2 98%   BMI 28.64  kg/m   Objective:   Physical Exam Vitals and nursing note reviewed.  Constitutional:      General: He is not in acute distress.    Appearance: Normal appearance. He is not ill-appearing.  HENT:     Head: Normocephalic.     Nose: Nose normal. No congestion.     Mouth/Throat:     Mouth: Mucous membranes are moist.     Pharynx: No oropharyngeal exudate.  Eyes:     Extraocular Movements: Extraocular movements intact.     Conjunctiva/sclera: Conjunctivae normal.     Pupils: Pupils are equal, round, and reactive to light.  Cardiovascular:     Rate and Rhythm: Normal rate and regular rhythm.     Pulses: Normal pulses.     Heart sounds: Normal heart  sounds. No murmur heard.   Pulmonary:     Effort: Pulmonary effort is normal.     Breath sounds: Normal breath sounds. No wheezing, rhonchi or rales.  Musculoskeletal:        General: Tenderness present. Normal range of motion.     Right lower leg: No edema.     Left lower leg: No edema.     Comments: +rt mtp with erythema and swelling over rt bunion. Pain with rom of rt great toe.  Skin:    General: Skin is warm and dry.     Findings: No rash.  Neurological:     General: No focal deficit present.     Mental Status: He is alert and oriented to person, place, and time.     Cranial Nerves: No cranial nerve deficit.  Psychiatric:        Mood and Affect: Mood normal.        Behavior: Behavior normal.        Thought Content: Thought content normal.        Judgment: Judgment normal.      Assessment and Plan   1. Pain of right great toe   Likely bunion inflammation and OA in the Rt MTP.   Gave pt script for prednisone taper for now and gave 1 refill if needed for his trip to [place], since will be hiking in and not near medical center and can only helicopter in to get medical treatment.  Gave voltaren for pain if having discomfort between now and then.   Reviewed medications and how to use.  To take with food.  Pt in agreement.  F/u prn.

## 2019-09-11 NOTE — Telephone Encounter (Signed)
Error please close made appointment instead °

## 2020-02-18 ENCOUNTER — Other Ambulatory Visit: Payer: Self-pay | Admitting: *Deleted

## 2020-02-18 ENCOUNTER — Telehealth: Payer: Self-pay

## 2020-02-18 DIAGNOSIS — Z13220 Encounter for screening for lipoid disorders: Secondary | ICD-10-CM

## 2020-02-18 DIAGNOSIS — R5383 Other fatigue: Secondary | ICD-10-CM

## 2020-02-18 DIAGNOSIS — Z79899 Other long term (current) drug therapy: Secondary | ICD-10-CM

## 2020-02-18 NOTE — Telephone Encounter (Signed)
Scheduled for physical on 03/19/20 and would like orders put in for [facility] to have bloodwork done.  Does not need PSA done, just had that for another doctor.

## 2020-03-11 LAB — CMP14+EGFR
ALT: 22 IU/L (ref 0–44)
AST: 22 IU/L (ref 0–40)
Albumin/Globulin Ratio: 2 (ref 1.2–2.2)
Albumin: 4.7 g/dL (ref 3.8–4.8)
Alkaline Phosphatase: 75 IU/L (ref 44–121)
BUN/Creatinine Ratio: 11 (ref 10–24)
BUN: 13 mg/dL (ref 8–27)
Bilirubin Total: 0.6 mg/dL (ref 0.0–1.2)
CO2: 26 mmol/L (ref 20–29)
Calcium: 10.2 mg/dL (ref 8.6–10.2)
Chloride: 104 mmol/L (ref 96–106)
Creatinine, Ser: 1.15 mg/dL (ref 0.76–1.27)
GFR calc Af Amer: 78 mL/min/{1.73_m2} (ref >59)
GFR calc non Af Amer: 67 mL/min/{1.73_m2} (ref >59)
Globulin, Total: 2.3 g/dL (ref 1.5–4.5)
Glucose: 102 mg/dL — ABNORMAL HIGH (ref 65–99)
Potassium: 4.9 mmol/L (ref 3.5–5.2)
Sodium: 143 mmol/L (ref 134–144)
Total Protein: 7 g/dL (ref 6.0–8.5)

## 2020-03-11 LAB — LIPID PANEL
Chol/HDL Ratio: 5.8 ratio — ABNORMAL HIGH (ref 0.0–5.0)
Cholesterol, Total: 271 mg/dL — ABNORMAL HIGH (ref 100–199)
HDL: 47 mg/dL (ref >39)
LDL Chol Calc (NIH): 184 mg/dL — ABNORMAL HIGH (ref 0–99)
Triglycerides: 212 mg/dL — ABNORMAL HIGH (ref 0–149)
VLDL Cholesterol Cal: 40 mg/dL (ref 5–40)

## 2020-03-11 LAB — HEPATIC FUNCTION PANEL: Bilirubin, Direct: 0.15 mg/dL (ref 0.00–0.40)

## 2020-03-19 ENCOUNTER — Encounter: Payer: Self-pay | Admitting: Family Medicine

## 2020-03-19 ENCOUNTER — Other Ambulatory Visit: Payer: Self-pay

## 2020-03-19 ENCOUNTER — Ambulatory Visit (INDEPENDENT_AMBULATORY_CARE_PROVIDER_SITE_OTHER): Payer: PRIVATE HEALTH INSURANCE | Admitting: Family Medicine

## 2020-03-19 VITALS — BP 126/84 | HR 85 | Temp 98.2°F | Ht 67.5 in | Wt 182.8 lb

## 2020-03-19 DIAGNOSIS — E782 Mixed hyperlipidemia: Secondary | ICD-10-CM

## 2020-03-19 DIAGNOSIS — Z0000 Encounter for general adult medical examination without abnormal findings: Secondary | ICD-10-CM | POA: Diagnosis not present

## 2020-03-19 DIAGNOSIS — R7301 Impaired fasting glucose: Secondary | ICD-10-CM

## 2020-03-19 DIAGNOSIS — J3089 Other allergic rhinitis: Secondary | ICD-10-CM | POA: Diagnosis not present

## 2020-03-19 MED ORDER — ATORVASTATIN CALCIUM 20 MG PO TABS: 20.0000 mg | ORAL_TABLET | Freq: Every day | ORAL | 1 refills | Status: DC

## 2020-03-19 MED ORDER — FLUTICASONE PROPIONATE 50 MCG/ACT NA SUSP: 2.0000 | Freq: Every day | NASAL | 1 refills | Status: DC

## 2020-03-19 NOTE — Progress Notes (Signed)
Patient ID: Victor Patel, male    DOB: [birth year], 64 y.o.   MRN: [id number]   Chief Complaint  Patient presents with  . Annual Exam   Subjective:    HPI   The patient comes in today for a wellness visit.  A review of their health history was completed.  A review of medications was also completed.  Any needed refills; none  Eating habits: trying to eat healthy  Falls/  MVA accidents in past few months: none  Regular exercise: everyday walking  Specialist pt sees on regular basis: urologist- hx of prostate cancer- in remission  Preventative health issues were discussed.   Additional concerns: none  On Labs- Elevated cholesterol, 271, and ldl 184. Hdl 47. tg- 212.  Calculated ASCVD risk- 13.6% risk cvd in 10 yrs.  Last visit had inflammation in rt mtp with bunion and using prednisone and it resolves it. Not having any further pain with it.  Seeing Urology.  Pt had prostate cancer 2 yrs ago. Seeing them regularly, last visit 9/21.  Seeing yearly.  Walking or hiking regularly.  Walking 3 miles per day. Walking in [place] and did training and walking up hill to get training.  His diet recently-  Drinking 2 percent milk daily.  Knows his diet is not been as good recently and eating more high cholesterol items and fast food. Fried bolonga sandwich. Fried chicken and burgers.  Medical History Victor Patel has a past medical history of Hyperlipidemia and Hypertension.   Outpatient Encounter Medications as of 03/19/2020  Medication Sig  . aspirin EC 81 MG tablet Take 81 mg by mouth daily. (Patient not taking: Reported on 03/19/2020)  . atorvastatin (LIPITOR) 20 MG tablet Take 1 tablet (20 mg total) by mouth daily.  . diclofenac (VOLTAREN) 75 MG EC tablet Take 1 tablet (75 mg total) by mouth 2 (two) times daily. Prn pain. (Patient not taking: Reported on 03/19/2020)  . fluticasone (FLONASE) 50 MCG/ACT nasal spray Place 2 sprays into both nostrils daily.  Marland Kitchen ibuprofen  (ADVIL,MOTRIN) 200 MG tablet Take 400 mg by mouth every 8 (eight) hours as needed (for pain.). (Patient not taking: Reported on 03/19/2020)  . predniSONE (DELTASONE) 20 MG tablet Take 3 tab p.o. x3 days, then 2 tab p.o. x3 days, then 1 tab p.o. x3 days. Take for toe pain. (Patient not taking: Reported on 03/19/2020)  . pseudoephedrine-guaifenesin (MUCINEX D) 60-600 MG 12 hr tablet Take 1 tablet by mouth every 12 (twelve) hours as needed (for nasal congestion.).  (Patient not taking: Reported on 03/19/2020)  . Soft Lens Products (REWETTING DROPS) SOLN Apply 1-2 drops to eye daily as needed (for contatct lenses.). (Patient not taking: Reported on 03/19/2020)   No facility-administered encounter medications on file as of 03/19/2020.     Review of Systems  Constitutional: Negative for chills and fever.  HENT: Negative for congestion, rhinorrhea and sore throat.   Respiratory: Negative for cough, shortness of breath and wheezing.   Cardiovascular: Negative for chest pain and leg swelling.  Gastrointestinal: Negative for abdominal pain, diarrhea, nausea and vomiting.  Genitourinary: Negative for dysuria and frequency.  Skin: Negative for rash.  Neurological: Negative for dizziness, weakness and headaches.     Vitals BP 126/84   Pulse 85   Temp 98.2 F (36.8 C) (Oral)   Ht 5' 7.5" (1.715 m)   Wt 182 lb 12.8 oz (82.9 kg)   SpO2 98%   BMI 28.21 kg/m   Objective:   Physical Exam Vitals  and nursing note reviewed.  Constitutional:      General: He is not in acute distress.    Appearance: Normal appearance. He is not ill-appearing.  HENT:     Head: Normocephalic.     Nose: Nose normal. No congestion.     Mouth/Throat:     Mouth: Mucous membranes are moist.     Pharynx: No oropharyngeal exudate.  Eyes:     Extraocular Movements: Extraocular movements intact.     Conjunctiva/sclera: Conjunctivae normal.     Pupils: Pupils are equal, round, and reactive to light.  Cardiovascular:      Rate and Rhythm: Normal rate and regular rhythm.     Pulses: Normal pulses.     Heart sounds: Normal heart sounds. No murmur heard.   Pulmonary:     Effort: Pulmonary effort is normal.     Breath sounds: Normal breath sounds. No wheezing, rhonchi or rales.  Musculoskeletal:        General: Normal range of motion.     Right lower leg: No edema.     Left lower leg: No edema.  Skin:    General: Skin is warm and dry.     Findings: No rash.  Neurological:     General: No focal deficit present.     Mental Status: He is alert and oriented to person, place, and time.     Cranial Nerves: No cranial nerve deficit.  Psychiatric:        Mood and Affect: Mood normal.        Behavior: Behavior normal.        Thought Content: Thought content normal.        Judgment: Judgment normal.      Assessment and Plan   1. Well adult exam  2. Mixed hyperlipidemia - atorvastatin (LIPITOR) 20 MG tablet; Take 1 tablet (20 mg total) by mouth daily.  Dispense: 90 tablet; Refill: 1  3. Impaired fasting blood sugar  4. Environmental and seasonal allergies - fluticasone (FLONASE) 50 MCG/ACT nasal spray; Place 2 sprays into both nostrils daily.  Dispense: 16 g; Refill: 1   Slight elevation on glucose 102- watch carb intake and cont to increase in exercising  HLD- worsening. Recommending starting a statin. Long discussion held. Reviewed risk vs benefit of stating a statin with his elevated cholesterol and ASCVD risk of 13% of having CV event in the next 10 yrs.  In adults 52 to 63 years of age without diabetes mellitus and with LDL-C levels ?70 mg/dL (?1.49mmol/L), at a 10-year atherosclerotic cardiovascular disease risk of ?7.5 percent, start a moderate-intensity statin if a discussion of treatment options favors statin therapy."  HM- up to date on vaccine and covid vaccines.  Seasonal allergies- recommending flonase and zyrtec or allegra daily.  F/u 6 mo recheck labs and HLD

## 2020-06-02 ENCOUNTER — Telehealth: Payer: Self-pay

## 2020-06-02 ENCOUNTER — Other Ambulatory Visit: Payer: Self-pay | Admitting: Family Medicine

## 2020-06-02 MED ORDER — PREDNISONE 20 MG PO TABS: ORAL_TABLET | ORAL | 0 refills | Status: DC

## 2020-06-02 NOTE — Telephone Encounter (Signed)
Sent in prednisone for 5 days.  If not improved, then needs to be seen in person. Thx. Dr. Darene Lamer

## 2020-06-02 NOTE — Telephone Encounter (Signed)
Seen 4/30 for this issue and note at wellness visit on 11/10 about it. Does he need ov or can something be sent in?

## 2020-06-02 NOTE — Telephone Encounter (Signed)
Pt.notified

## 2020-06-02 NOTE — Telephone Encounter (Signed)
Pt was seen for this before is is a joint swollen in the foot and he is rotating with Advil and ice inflammation is in the joint and it is not working he has tried for 5 days. Can you call in something for inflammation.  Send to [facility]   Pt call back [phone]

## 2020-06-02 NOTE — Progress Notes (Signed)
Sent in prednisone for 5 days for rt mtp pain.  Has had this in past and prednisone helped.  Dr. Lovena Le

## 2020-09-16 ENCOUNTER — Encounter: Payer: Self-pay | Admitting: Family Medicine

## 2020-09-16 ENCOUNTER — Ambulatory Visit: Payer: PRIVATE HEALTH INSURANCE | Admitting: Family Medicine

## 2020-11-18 ENCOUNTER — Telehealth: Payer: Self-pay | Admitting: Family Medicine

## 2020-11-18 NOTE — Telephone Encounter (Signed)
Pt has an upcoming physical on 01/08/2021 and dropped of lab results from his job so Dr.Taylor would have them

## 2020-11-24 NOTE — Telephone Encounter (Signed)
The lab papers are up front per medical records to go back with patient at his visit. Patient would like to discuss during his appt.

## 2020-11-24 NOTE — Telephone Encounter (Signed)
Not sure i've seen these labs, do you all have them?   Thx.   Dr. Lovena Le

## 2021-01-08 ENCOUNTER — Ambulatory Visit (INDEPENDENT_AMBULATORY_CARE_PROVIDER_SITE_OTHER): Payer: No Typology Code available for payment source | Admitting: Family Medicine

## 2021-01-08 ENCOUNTER — Other Ambulatory Visit: Payer: Self-pay

## 2021-01-08 VITALS — BP 149/97 | HR 85 | Temp 98.6°F | Ht 67.5 in | Wt 184.0 lb

## 2021-01-08 DIAGNOSIS — Z0000 Encounter for general adult medical examination without abnormal findings: Secondary | ICD-10-CM

## 2021-01-08 DIAGNOSIS — E782 Mixed hyperlipidemia: Secondary | ICD-10-CM

## 2021-01-08 DIAGNOSIS — R030 Elevated blood-pressure reading, without diagnosis of hypertension: Secondary | ICD-10-CM | POA: Diagnosis not present

## 2021-01-08 DIAGNOSIS — Z0001 Encounter for general adult medical examination with abnormal findings: Secondary | ICD-10-CM | POA: Diagnosis not present

## 2021-01-08 NOTE — Progress Notes (Signed)
Patient ID: Victor Patel, male    DOB: [birth year], 65 y.o.   MRN: [id number]   Chief Complaint  Patient presents with   adult wellness    Subjective:    HPI The patient comes in today for a wellness visit.  A review of their health history was completed.  A review of medications was also completed.  Any needed refills; no  Eating habits: excellent  Falls/  MVA accidents in past few months: no  Regular exercise: yes, walk, weight train   Specialist pt sees on regular basis: Dr Dietrich Pates, [facility] Preventative health issues were discussed.   Additional concerns: patient decreased cholesterol levels , had labs done through work  Labs done at work-  Ldl 120,  Hld 47 Total cholesterol 197, TG- 150 A1c- 4.6  Glucose fasting 110- Bp 120/78 that day. Bmi -26   After 1 month taking it, had a headache from lipitor '20mg'$ . Stopped 2% milk and drinking soy milk. Changed his diet. Eating nuts assorted.  Eating a baked sweet potato with pure raw honey and teaspoon cinnamon/supplement.  Drinking glass vinegar water 2 tablespoons. Fish oil in am. Dinner- cut back on read meat, cut back on fries some. Eating grilled chicken more. More salads.  Baked veggies. Splurging 1x per week.  Last time cholesterol- 271, and ldl 184, tg 212. Getting psa checked at [facility]. H/o prostate cancer 3 yrs ago, and non -detectable.   Doing more walking and doing some weight training.  Medical History Victor Patel has a past medical history of Hyperlipidemia and Hypertension.   Outpatient Encounter Medications as of 01/08/2021  Medication Sig   aspirin EC 81 MG tablet Take 81 mg by mouth daily. (Patient not taking: No sig reported)   ibuprofen (ADVIL,MOTRIN) 200 MG tablet Take 400 mg by mouth every 8 (eight) hours as needed (for pain.). (Patient not taking: No sig reported)   Soft Lens Products (REWETTING DROPS) SOLN Apply 1-2 drops to eye daily as needed (for contatct lenses.). (Patient  not taking: No sig reported)   [DISCONTINUED] atorvastatin (LIPITOR) 20 MG tablet Take 1 tablet (20 mg total) by mouth daily. (Patient not taking: Reported on 01/08/2021)   [DISCONTINUED] diclofenac (VOLTAREN) 75 MG EC tablet Take 1 tablet (75 mg total) by mouth 2 (two) times daily. Prn pain. (Patient not taking: No sig reported)   [DISCONTINUED] fluticasone (FLONASE) 50 MCG/ACT nasal spray Place 2 sprays into both nostrils daily. (Patient not taking: Reported on 01/08/2021)   [DISCONTINUED] predniSONE (DELTASONE) 20 MG tablet Take 2 tab p.o. x 5 days. Take with food. (Patient not taking: Reported on 01/08/2021)   [DISCONTINUED] pseudoephedrine-guaifenesin (MUCINEX D) 60-600 MG 12 hr tablet Take 1 tablet by mouth every 12 (twelve) hours as needed (for nasal congestion.).  (Patient not taking: No sig reported)   No facility-administered encounter medications on file as of 01/08/2021.     Review of Systems  Constitutional:  Negative for chills and fever.  HENT:  Negative for congestion, rhinorrhea and sore throat.   Respiratory:  Negative for cough, shortness of breath and wheezing.   Cardiovascular:  Negative for chest pain and leg swelling.  Gastrointestinal:  Negative for abdominal pain, diarrhea, nausea and vomiting.  Genitourinary:  Negative for dysuria and frequency.  Skin:  Negative for rash.  Neurological:  Negative for dizziness, weakness and headaches.    Vitals BP (!) 149/97   Pulse 85   Temp 98.6 F (37 C)   Ht 5' 7.5" (1.715 m)  Wt 184 lb (83.5 kg)   SpO2 98%   BMI 28.39 kg/m   Objective:   Physical Exam Vitals and nursing note reviewed.  Constitutional:      General: He is not in acute distress.    Appearance: Normal appearance. He is not ill-appearing.  HENT:     Head: Normocephalic.     Nose: Nose normal. No congestion.     Mouth/Throat:     Mouth: Mucous membranes are moist.     Pharynx: No oropharyngeal exudate.  Eyes:     Extraocular Movements: Extraocular  movements intact.     Conjunctiva/sclera: Conjunctivae normal.     Pupils: Pupils are equal, round, and reactive to light.  Cardiovascular:     Rate and Rhythm: Normal rate and regular rhythm.     Pulses: Normal pulses.     Heart sounds: Normal heart sounds. No murmur heard. Pulmonary:     Effort: Pulmonary effort is normal.     Breath sounds: Normal breath sounds. No wheezing, rhonchi or rales.  Musculoskeletal:        General: Normal range of motion.     Right lower leg: No edema.     Left lower leg: No edema.  Skin:    General: Skin is warm and dry.     Findings: No rash.  Neurological:     General: No focal deficit present.     Mental Status: He is alert and oriented to person, place, and time.     Cranial Nerves: No cranial nerve deficit.  Psychiatric:        Mood and Affect: Mood normal.        Behavior: Behavior normal.        Thought Content: Thought content normal.        Judgment: Judgment normal.     Assessment and Plan   1. Well adult exam  2. Mixed hyperlipidemia  3. Elevated blood pressure reading   Pt stating has white coat syndrome in office.  Last few bp readings were normal.  Cont to monitor.  Recheck on next visit.  Reviewed vaccines and labs.  Pt improved on cholesterol numbers.  Cont with vinegar daily and supplements as discussed.  Cont exercising and diet modifications.  Return in about 1 year (around 01/08/2022) for wellness.

## 2021-03-26 ENCOUNTER — Other Ambulatory Visit: Payer: Self-pay

## 2021-03-26 ENCOUNTER — Ambulatory Visit (INDEPENDENT_AMBULATORY_CARE_PROVIDER_SITE_OTHER): Payer: No Typology Code available for payment source | Admitting: Nurse Practitioner

## 2021-03-26 ENCOUNTER — Encounter: Payer: Self-pay | Admitting: Nurse Practitioner

## 2021-03-26 VITALS — BP 154/102 | HR 81 | Ht 67.5 in | Wt 187.2 lb

## 2021-03-26 DIAGNOSIS — Z131 Encounter for screening for diabetes mellitus: Secondary | ICD-10-CM

## 2021-03-26 DIAGNOSIS — J019 Acute sinusitis, unspecified: Secondary | ICD-10-CM

## 2021-03-26 DIAGNOSIS — I10 Essential (primary) hypertension: Secondary | ICD-10-CM | POA: Diagnosis not present

## 2021-03-26 DIAGNOSIS — F419 Anxiety disorder, unspecified: Secondary | ICD-10-CM | POA: Diagnosis not present

## 2021-03-26 MED ORDER — AMOXICILLIN-POT CLAVULANATE 875-125 MG PO TABS: 1.0000 | ORAL_TABLET | Freq: Two times a day (BID) | ORAL | 0 refills | Status: AC

## 2021-03-26 MED ORDER — VALSARTAN 80 MG PO TABS: 80.0000 mg | ORAL_TABLET | Freq: Every day | ORAL | 3 refills | Status: DC

## 2021-03-26 MED ORDER — LOSARTAN POTASSIUM 25 MG PO TABS
25.0000 mg | ORAL_TABLET | Freq: Every day | ORAL | 0 refills | Status: DC
Start: 2021-03-26 — End: 2021-03-26

## 2021-03-26 NOTE — Progress Notes (Signed)
Subjective:    Patient ID: Victor Patel, male    DOB: [birth year], 65 y.o.   MRN: [id number]  HPI  Patient arrives to discuss blood pressure. Patient states his blood pressure has been running high due to being under a lot of stress at work and home. Patient states that his step daughter is now living with him and his wife. Patient also admits to his job being very stressful. Patient reports BP at home ranging from 637-858 systolic and 85-02 diastolic. However, on Monday his BP was 180/120. Patient denies fever, chills, cough, sore throat.   Patient also states he has had sinus symptoms for over a month and feels like he needs an antibiotic to get rid of it. Patient has tried Nyquil and Sinutab without relief. Patient states that he also has had a headache in the frontal region of his head. Patient admits to mucous green sinus discharge.   Patient also admits to having some anxiety. Patient treated for anxiety a couple years ago with low dose Xanax.   Review of Systems  Constitutional:  Negative for chills and fever.  HENT:  Positive for congestion, postnasal drip, sinus pressure and sinus pain. Negative for rhinorrhea, sneezing and sore throat.   Respiratory:  Negative for shortness of breath.   Psychiatric/Behavioral:  The patient is nervous/anxious.       Objective:   Physical Exam Constitutional:      Appearance: Normal appearance.  HENT:     Head:     Comments: No tenderness to palpation of the frontal sinus or maxillary sinus.    Right Ear: Tympanic membrane, ear canal and external ear normal.     Left Ear: Tympanic membrane, ear canal and external ear normal.     Nose: Nose normal. No congestion or rhinorrhea.     Mouth/Throat:     Mouth: Mucous membranes are moist.     Pharynx: Oropharynx is clear. No oropharyngeal exudate or posterior oropharyngeal erythema.  Cardiovascular:     Rate and Rhythm: Normal rate and regular rhythm.     Pulses: Normal pulses.     Heart  sounds: Normal heart sounds. No murmur heard.   No friction rub. No gallop.  Pulmonary:     Effort: Pulmonary effort is normal. No respiratory distress.     Breath sounds: Normal breath sounds. No wheezing or rhonchi.  Neurological:     General: No focal deficit present.     Mental Status: He is alert and oriented to person, place, and time.  Psychiatric:        Mood and Affect: Mood normal.        Behavior: Behavior normal.          Assessment & Plan:   1. Primary hypertension - B/P 169/90 today and 154/102 on recheck. - CMP14+EGFR - Hepatic function panel - Lipid Panel With LDL/HDL Ratio - Start Valsartan $RemoveBeforeD'80mg'[id number]$  daily. - F/u in 4 weeks to recheck BP - Stress management. - Exercise.   2. Acute non-recurrent sinusitis, unspecified location  - amoxicillin-clavulanate (AUGMENTIN) 875-125 MG tablet; Take 1 tablet by mouth 2 (two) times daily for 5 days.  Dispense: 10 tablet; Refill: 0  3. Screening for diabetes mellitus (DM) - HgB A1c - Will notify patient of any abnormal labs.   4. Anxiety - Patient states that he would like something for anxiety. - Went over some stress management techniques. - If stress management techniques not effective at 4 week f/u, will consider short course of  Atarax or Xanax.

## 2021-03-27 LAB — LIPID PANEL WITH LDL/HDL RATIO
Cholesterol, Total: 258 mg/dL — ABNORMAL HIGH (ref 100–199)
HDL: 40 mg/dL (ref >39)
LDL Chol Calc (NIH): 139 mg/dL — ABNORMAL HIGH (ref 0–99)
LDL/HDL Ratio: 3.5 ratio (ref 0.0–3.6)
Triglycerides: 428 mg/dL — ABNORMAL HIGH (ref 0–149)
VLDL Cholesterol Cal: 79 mg/dL — ABNORMAL HIGH (ref 5–40)

## 2021-03-27 LAB — CMP14+EGFR
ALT: 15 IU/L (ref 0–44)
AST: 17 IU/L (ref 0–40)
Albumin/Globulin Ratio: 1.7 (ref 1.2–2.2)
Albumin: 4.7 g/dL (ref 3.8–4.8)
Alkaline Phosphatase: 85 IU/L (ref 44–121)
BUN/Creatinine Ratio: 14 (ref 10–24)
BUN: 13 mg/dL (ref 8–27)
Bilirubin Total: 0.4 mg/dL (ref 0.0–1.2)
CO2: 27 mmol/L (ref 20–29)
Calcium: 9.5 mg/dL (ref 8.6–10.2)
Chloride: 103 mmol/L (ref 96–106)
Creatinine, Ser: 0.92 mg/dL (ref 0.76–1.27)
Globulin, Total: 2.7 g/dL (ref 1.5–4.5)
Glucose: 113 mg/dL — ABNORMAL HIGH (ref 70–99)
Potassium: 4.9 mmol/L (ref 3.5–5.2)
Sodium: 144 mmol/L (ref 134–144)
Total Protein: 7.4 g/dL (ref 6.0–8.5)
eGFR: 93 mL/min/{1.73_m2} (ref >59)

## 2021-03-27 LAB — HEMOGLOBIN A1C
Est. average glucose Bld gHb Est-mCnc: 108 mg/dL
Hgb A1c MFr Bld: 5.4 % (ref 4.8–5.6)

## 2021-03-27 LAB — HEPATIC FUNCTION PANEL: Bilirubin, Direct: 0.13 mg/dL (ref 0.00–0.40)

## 2021-03-30 NOTE — Progress Notes (Signed)
Good Morning Victor Patel,  Your labs were reviewed. Your Glucose level is normal. It is normal for glucose levels to be slightly elevated after eating. Your A1C is perfect and I have no concerns regarding pre-diabetes or diabetes at this time.   However, your lipids (Cholesterol) are elevated and should be addressed. Continue to eat well and exercise. We will discuss medication options at the next visit in approximately 3 weeks.   Change of subject: I thought about you this weekend and I hope your stress levels are getting better. Current research shows that therapy along with starting an SSRI (a class of medication for anxiety and depression) greatly improves anxiety when compared to Xanax. We can definitely discuss more at our next visit. Happy Thanksgiving. See you soon.

## 2021-04-23 ENCOUNTER — Other Ambulatory Visit: Payer: Self-pay

## 2021-04-23 ENCOUNTER — Ambulatory Visit (INDEPENDENT_AMBULATORY_CARE_PROVIDER_SITE_OTHER): Payer: No Typology Code available for payment source | Admitting: Family Medicine

## 2021-04-23 VITALS — BP 126/84 | HR 84 | Ht 67.5 in | Wt 188.4 lb

## 2021-04-23 DIAGNOSIS — E782 Mixed hyperlipidemia: Secondary | ICD-10-CM | POA: Diagnosis not present

## 2021-04-23 DIAGNOSIS — Z23 Encounter for immunization: Secondary | ICD-10-CM

## 2021-04-23 DIAGNOSIS — I10 Essential (primary) hypertension: Secondary | ICD-10-CM | POA: Insufficient documentation

## 2021-04-23 MED ORDER — ROSUVASTATIN CALCIUM 10 MG PO TABS: 10.0000 mg | ORAL_TABLET | Freq: Every day | ORAL | 3 refills | Status: DC

## 2021-04-23 NOTE — Assessment & Plan Note (Addendum)
Well-controlled.  Continue valsartan.

## 2021-04-23 NOTE — Assessment & Plan Note (Signed)
Uncontrolled.  Starting Crestor.

## 2021-04-23 NOTE — Progress Notes (Signed)
Subjective:  Patient ID: Victor Patel, male    DOB: 06-Jun-1955  Age: 65 y.o. MRN: [id number]  CC: Chief Complaint  Patient presents with   Hypertension    Follow up Been under a lot of stress and caused elevated blood pressure-stared Valsartan last month and stress has gotten better    HPI:  65 year old male with history of prostate cancer, hypertension, hyperlipidemia presents for follow-up.  Patient recently started on valsartan for hypertension.  He states that his blood pressures are now well controlled and he is doing well.  He denies any side effects from medication.  Patient's most recent blood work revealed elevated cholesterol, triglycerides, and LDL.  Suspect that some of the triglyceride elevation was secondary to his blood work not being fasting.  However, his total cholesterol and LDL continue to be elevated consistent with prior laboratory studies.  He has been on Lipitor previously.  He would like to discuss potential pharmacotherapy today.  Patient reports that his anxiety level has improved.  He was previously stressed due to guests in his home with dogs.  States that they are now gone and he is back to his normal state.  Patient Active Problem List   Diagnosis Date Noted   Essential hypertension 04/23/2021   Prostate cancer ([place]) 02/05/2019   Hyperlipidemia 10/09/2012    Social Hx   Social History   Socioeconomic History   Marital status: Single    Spouse name: Not on file   Number of children: Not on file   Years of education: Not on file   Highest education level: Not on file  Occupational History   Not on file  Tobacco Use   Smoking status: Never   Smokeless tobacco: Never  Vaping Use   Vaping Use: Never used  Substance and Sexual Activity   Alcohol use: No   Drug use: No   Sexual activity: Not on file  Other Topics Concern   Not on file  Social History Narrative   Not on file   Social Determinants of Health   Financial Resource Strain:  Not on file  Food Insecurity: Not on file  Transportation Needs: Not on file  Physical Activity: Not on file  Stress: Not on file  Social Connections: Not on file    Review of Systems  Constitutional: Negative.   Psychiatric/Behavioral:  The patient is nervous/anxious.     Objective:  BP 126/84    Pulse 84    Ht 5' 7.5" (1.715 m)    Wt 188 lb 6.4 oz (85.5 kg)    SpO2 98%    BMI 29.07 kg/m   BP/Weight 04/23/2021 43/32/9518 12/12/1658  Systolic BP 630 160 109  Diastolic BP 84 323 97  Wt. (Lbs) 188.4 187.2 184  BMI 29.07 28.89 28.39    Physical Exam Vitals and nursing note reviewed.  Constitutional:      General: He is not in acute distress.    Appearance: Normal appearance. He is not ill-appearing.  HENT:     Head: Normocephalic and atraumatic.  Eyes:     General:        Right eye: No discharge.        Left eye: No discharge.     Conjunctiva/sclera: Conjunctivae normal.  Cardiovascular:     Rate and Rhythm: Normal rate and regular rhythm.  Pulmonary:     Effort: Pulmonary effort is normal.     Breath sounds: Normal breath sounds. No wheezing, rhonchi or rales.  Neurological:  Mental Status: He is alert.  Psychiatric:        Mood and Affect: Mood normal.        Behavior: Behavior normal.    Lab Results  Component Value Date   GLUCOSE 113 (H) 03/26/2021   CHOL 258 (H) 03/26/2021   TRIG 428 (H) 03/26/2021   HDL 40 03/26/2021   LDLCALC 139 (H) 03/26/2021   ALT 15 03/26/2021   AST 17 03/26/2021   NA 144 03/26/2021   K 4.9 03/26/2021   CL 103 03/26/2021   CREATININE 0.92 03/26/2021   BUN 13 03/26/2021   CO2 27 03/26/2021   PSA 3.71 09/13/2012   HGBA1C 5.4 03/26/2021     Assessment & Plan:   Problem List Items Addressed This Visit       Cardiovascular and Mediastinum   Essential hypertension - Primary    Well-controlled.  Continue valsartan.      Relevant Medications   rosuvastatin (CRESTOR) 10 MG tablet     Other   Hyperlipidemia     Uncontrolled.  Starting Crestor.      Relevant Medications   rosuvastatin (CRESTOR) 10 MG tablet   Other Visit Diagnoses     Immunization due       Relevant Orders   Flu Vaccine QUAD 42mo+IM (Fluarix, Fluzone & Alfiuria Quad PF) (Completed)       Meds ordered this encounter  Medications   rosuvastatin (CRESTOR) 10 MG tablet    Sig: Take 1 tablet (10 mg total) by mouth daily.    Dispense:  90 tablet    Refill:  3    Follow-up:  Return in about 6 months (around 10/22/2021).  [place]

## 2021-04-23 NOTE — Patient Instructions (Signed)
Follow up in 6 months  Continue on Valsartan. Start Crestor.  Take care  Dr. Lacinda Axon

## 2022-01-26 DIAGNOSIS — L218 Other seborrheic dermatitis: Secondary | ICD-10-CM | POA: Diagnosis not present

## 2022-01-26 DIAGNOSIS — X32XXXD Exposure to sunlight, subsequent encounter: Secondary | ICD-10-CM | POA: Diagnosis not present

## 2022-01-26 DIAGNOSIS — B078 Other viral warts: Secondary | ICD-10-CM | POA: Diagnosis not present

## 2022-01-26 DIAGNOSIS — L570 Actinic keratosis: Secondary | ICD-10-CM | POA: Diagnosis not present

## 2022-02-11 DIAGNOSIS — C61 Malignant neoplasm of prostate: Secondary | ICD-10-CM | POA: Diagnosis not present

## 2022-02-11 DIAGNOSIS — R9721 Rising PSA following treatment for malignant neoplasm of prostate: Secondary | ICD-10-CM | POA: Diagnosis not present

## 2022-02-15 ENCOUNTER — Telehealth: Payer: Self-pay | Admitting: Family Medicine

## 2022-02-15 NOTE — Telephone Encounter (Signed)
Patient has an upcoming appt on 02/24/22, most recent labs were on 03/2021 CMP, Lipid, A1c, hepatic panel

## 2022-02-15 NOTE — Telephone Encounter (Signed)
Patient has physical on 10/18 and needing labs done . He states doesn't need  PSA had done at specialist office

## 2022-02-17 ENCOUNTER — Other Ambulatory Visit: Payer: Self-pay | Admitting: Family Medicine

## 2022-02-17 DIAGNOSIS — I10 Essential (primary) hypertension: Secondary | ICD-10-CM

## 2022-02-17 DIAGNOSIS — E782 Mixed hyperlipidemia: Secondary | ICD-10-CM

## 2022-02-17 DIAGNOSIS — C61 Malignant neoplasm of prostate: Secondary | ICD-10-CM | POA: Diagnosis not present

## 2022-02-18 LAB — CBC
Hematocrit: 50 % (ref 37.5–51.0)
Hemoglobin: 17.3 g/dL (ref 13.0–17.7)
MCH: 29.7 pg (ref 26.6–33.0)
MCHC: 34.6 g/dL (ref 31.5–35.7)
MCV: 86 fL (ref 79–97)
Platelets: 237 10*3/uL (ref 150–450)
RBC: 5.83 x10E6/uL — ABNORMAL HIGH (ref 4.14–5.80)
RDW: 12.1 % (ref 11.6–15.4)
WBC: 8.2 10*3/uL (ref 3.4–10.8)

## 2022-02-18 LAB — LIPID PANEL
Chol/HDL Ratio: 3.8 ratio (ref 0.0–5.0)
Cholesterol, Total: 187 mg/dL (ref 100–199)
HDL: 49 mg/dL (ref >39)
LDL Chol Calc (NIH): 111 mg/dL — ABNORMAL HIGH (ref 0–99)
Triglycerides: 153 mg/dL — ABNORMAL HIGH (ref 0–149)
VLDL Cholesterol Cal: 27 mg/dL (ref 5–40)

## 2022-02-18 LAB — CMP14+EGFR
ALT: 31 IU/L (ref 0–44)
AST: 29 IU/L (ref 0–40)
Albumin/Globulin Ratio: 2 (ref 1.2–2.2)
Albumin: 4.8 g/dL (ref 3.9–4.9)
Alkaline Phosphatase: 75 IU/L (ref 44–121)
BUN/Creatinine Ratio: 13 (ref 10–24)
BUN: 14 mg/dL (ref 8–27)
Bilirubin Total: 1.1 mg/dL (ref 0.0–1.2)
CO2: 23 mmol/L (ref 20–29)
Calcium: 9.7 mg/dL (ref 8.6–10.2)
Chloride: 103 mmol/L (ref 96–106)
Creatinine, Ser: 1.12 mg/dL (ref 0.76–1.27)
Globulin, Total: 2.4 g/dL (ref 1.5–4.5)
Glucose: 101 mg/dL — ABNORMAL HIGH (ref 70–99)
Potassium: 4.6 mmol/L (ref 3.5–5.2)
Sodium: 142 mmol/L (ref 134–144)
Total Protein: 7.2 g/dL (ref 6.0–8.5)
eGFR: 73 mL/min/{1.73_m2} (ref >59)

## 2022-02-24 ENCOUNTER — Encounter: Payer: Self-pay | Admitting: Family Medicine

## 2022-02-24 ENCOUNTER — Ambulatory Visit (INDEPENDENT_AMBULATORY_CARE_PROVIDER_SITE_OTHER): Payer: Medicare HMO | Admitting: Family Medicine

## 2022-02-24 VITALS — BP 134/82 | HR 96 | Temp 98.4°F | Ht 66.75 in | Wt 181.1 lb

## 2022-02-24 DIAGNOSIS — Z0000 Encounter for general adult medical examination without abnormal findings: Secondary | ICD-10-CM

## 2022-02-24 NOTE — Patient Instructions (Signed)
Continue your meds.  Follow up annually.  Take care  Dr. Lacinda Axon

## 2022-02-25 DIAGNOSIS — Z0000 Encounter for general adult medical examination without abnormal findings: Secondary | ICD-10-CM | POA: Insufficient documentation

## 2022-02-25 NOTE — Progress Notes (Signed)
Subjective:  Patient ID: Victor Patel, male    DOB: 07/22/1955  Age: 66 y.o. MRN: [id number]  CC: Chief Complaint  Patient presents with   Annual Exam    No issues at this time    HPI:  66 year old male with a history of prostate cancer status post prostatectomy followed at [facility], hyperlipidemia, hypertension presents for an annual physical exam.  Patient states that he is doing well.  He is followed at [facility] regarding prostate cancer.  His hypertension is well controlled.  He remains on Crestor for hyperlipidemia.  In regards to his preventative health care, he declines hepatitis C screening today.  He states that he will not be taking any more COVID vaccines.  Declines influenza vaccine today.  Also declines pneumonia vaccine today.  Colonoscopy up-to-date.  Tetanus up-to-date.  Shingrix has been completed.  Patient Active Problem List   Diagnosis Date Noted   Annual physical exam 02/25/2022   Essential hypertension 04/23/2021   Prostate cancer ([place]) 02/05/2019   Hyperlipidemia 10/09/2012    Social Hx   Social History   Socioeconomic History   Marital status: Single    Spouse name: Not on file   Number of children: Not on file   Years of education: Not on file   Highest education level: Not on file  Occupational History   Not on file  Tobacco Use   Smoking status: Never   Smokeless tobacco: Never  Vaping Use   Vaping Use: Never used  Substance and Sexual Activity   Alcohol use: No   Drug use: No   Sexual activity: Not on file  Other Topics Concern   Not on file  Social History Narrative   Not on file   Social Determinants of Health   Financial Resource Strain: Not on file  Food Insecurity: Not on file  Transportation Needs: Not on file  Physical Activity: Not on file  Stress: Not on file  Social Connections: Not on file    Review of Systems  Constitutional: Negative.   Respiratory: Negative.    Cardiovascular: Negative.    Objective:  BP 134/82    Pulse 96   Temp 98.4 F (36.9 C)   Ht 5' 6.75" (1.695 m)   Wt 181 lb 1.6 oz (82.1 kg)   SpO2 96%   BMI 28.58 kg/m      02/24/2022    9:04 AM 04/23/2021    1:25 PM 03/26/2021    2:48 PM  BP/Weight  Systolic BP 737 106 269  Diastolic BP 82 84 485  Wt. (Lbs) 181.1 188.4   BMI 28.58 kg/m2 29.07 kg/m2     Physical Exam Vitals and nursing note reviewed.  Constitutional:      General: He is not in acute distress.    Appearance: Normal appearance.  HENT:     Head: Normocephalic and atraumatic.     Nose: Nose normal.     Mouth/Throat:     Pharynx: Oropharynx is clear.  Eyes:     General:        Right eye: No discharge.        Left eye: No discharge.     Conjunctiva/sclera: Conjunctivae normal.  Cardiovascular:     Rate and Rhythm: Normal rate and regular rhythm.  Pulmonary:     Effort: Pulmonary effort is normal.     Breath sounds: Normal breath sounds. No wheezing, rhonchi or rales.  Abdominal:     General: There is no distension.  Palpations: Abdomen is soft.     Tenderness: There is no abdominal tenderness.  Musculoskeletal:     Cervical back: Neck supple.  Skin:    General: Skin is warm.     Findings: No rash.  Neurological:     General: No focal deficit present.     Mental Status: He is alert.  Psychiatric:        Mood and Affect: Mood normal.        Behavior: Behavior normal.     Lab Results  Component Value Date   WBC 8.2 02/17/2022   HGB 17.3 02/17/2022   HCT 50.0 02/17/2022   PLT 237 02/17/2022   GLUCOSE 101 (H) 02/17/2022   CHOL 187 02/17/2022   TRIG 153 (H) 02/17/2022   HDL 49 02/17/2022   LDLCALC 111 (H) 02/17/2022   ALT 31 02/17/2022   AST 29 02/17/2022   NA 142 02/17/2022   K 4.6 02/17/2022   CL 103 02/17/2022   CREATININE 1.12 02/17/2022   BUN 14 02/17/2022   CO2 23 02/17/2022   PSA 3.71 09/13/2012   HGBA1C 5.4 03/26/2021     Assessment & Plan:   Problem List Items Addressed This Visit       Other   Annual physical  exam - Primary    Patient is doing very well. Discussed his recent labs. Discussed preventative health care.  These were updated today. He is doing quite well.  Follow-up annually.       Follow-up:  Annually  Thersa Salt DO [place]

## 2022-02-25 NOTE — Assessment & Plan Note (Signed)
Patient is doing very well. Discussed his recent labs. Discussed preventative health care.  These were updated today. He is doing quite well.  Follow-up annually.

## 2022-03-25 ENCOUNTER — Other Ambulatory Visit: Payer: Self-pay | Admitting: Nurse Practitioner

## 2022-03-25 DIAGNOSIS — I10 Essential (primary) hypertension: Secondary | ICD-10-CM

## 2022-04-05 ENCOUNTER — Ambulatory Visit (INDEPENDENT_AMBULATORY_CARE_PROVIDER_SITE_OTHER): Payer: Medicare HMO | Admitting: Nurse Practitioner

## 2022-04-05 DIAGNOSIS — Z029 Encounter for administrative examinations, unspecified: Secondary | ICD-10-CM

## 2022-04-06 NOTE — Progress Notes (Signed)
Canceled appointment.

## 2022-05-07 ENCOUNTER — Other Ambulatory Visit: Payer: Self-pay | Admitting: Family Medicine

## 2022-05-13 DIAGNOSIS — R9721 Rising PSA following treatment for malignant neoplasm of prostate: Secondary | ICD-10-CM | POA: Diagnosis not present

## 2022-05-13 DIAGNOSIS — C61 Malignant neoplasm of prostate: Secondary | ICD-10-CM | POA: Diagnosis not present

## 2022-06-09 DIAGNOSIS — M9901 Segmental and somatic dysfunction of cervical region: Secondary | ICD-10-CM | POA: Diagnosis not present

## 2022-06-09 DIAGNOSIS — M542 Cervicalgia: Secondary | ICD-10-CM | POA: Diagnosis not present

## 2022-06-09 DIAGNOSIS — M546 Pain in thoracic spine: Secondary | ICD-10-CM | POA: Diagnosis not present

## 2022-06-09 DIAGNOSIS — M9902 Segmental and somatic dysfunction of thoracic region: Secondary | ICD-10-CM | POA: Diagnosis not present

## 2022-06-09 DIAGNOSIS — M9903 Segmental and somatic dysfunction of lumbar region: Secondary | ICD-10-CM | POA: Diagnosis not present

## 2022-06-11 DIAGNOSIS — R9721 Rising PSA following treatment for malignant neoplasm of prostate: Secondary | ICD-10-CM | POA: Diagnosis not present

## 2022-06-11 DIAGNOSIS — E041 Nontoxic single thyroid nodule: Secondary | ICD-10-CM | POA: Diagnosis not present

## 2022-06-11 DIAGNOSIS — C61 Malignant neoplasm of prostate: Secondary | ICD-10-CM | POA: Diagnosis not present

## 2022-06-16 ENCOUNTER — Telehealth: Payer: Self-pay | Admitting: "Endocrinology

## 2022-06-16 NOTE — Telephone Encounter (Signed)
Pt is asking if he can be seen here for a nodule that was found at [facility] - he said this was found on a PET scan. Can you advise on this?

## 2022-06-29 ENCOUNTER — Ambulatory Visit (INDEPENDENT_AMBULATORY_CARE_PROVIDER_SITE_OTHER): Payer: Medicare HMO | Admitting: Family Medicine

## 2022-06-29 ENCOUNTER — Encounter: Payer: Self-pay | Admitting: Family Medicine

## 2022-06-29 VITALS — BP 146/100 | HR 99 | Ht 66.75 in | Wt 187.0 lb

## 2022-06-29 DIAGNOSIS — E041 Nontoxic single thyroid nodule: Secondary | ICD-10-CM | POA: Diagnosis not present

## 2022-06-29 NOTE — Telephone Encounter (Signed)
Ok, still awaiting a referral

## 2022-06-29 NOTE — Progress Notes (Signed)
Subjective:  Patient ID: Victor Patel, male    DOB: 05-19-1955  Age: 67 y.o. MRN: [id number]  CC: Chief Complaint  Patient presents with   abnormal scan of the thyroid    Done via [facility] early Feb 2024 Follow up     HPI:  67 year old male presents for evaluation of the above.  Patient recently had a PET scan via urology.  It revealed an incidental 2 cm left thyroid nodule.  Ultrasound has been recommended for further evaluation.  Patient presents today to discuss this.  Patient is otherwise feeling well has no other complaints at this time.  Patient Active Problem List   Diagnosis Date Noted   Thyroid nodule 06/29/2022   Annual physical exam 02/25/2022   Essential hypertension 04/23/2021   Prostate cancer ([place]) 02/05/2019   Hyperlipidemia 10/09/2012    Social Hx   Social History   Socioeconomic History   Marital status: Single    Spouse name: Not on file   Number of children: Not on file   Years of education: Not on file   Highest education level: Not on file  Occupational History   Not on file  Tobacco Use   Smoking status: Never   Smokeless tobacco: Never  Vaping Use   Vaping Use: Never used  Substance and Sexual Activity   Alcohol use: No   Drug use: No   Sexual activity: Not on file  Other Topics Concern   Not on file  Social History Narrative   Not on file   Social Determinants of Health   Financial Resource Strain: Not on file  Food Insecurity: Not on file  Transportation Needs: Not on file  Physical Activity: Not on file  Stress: Not on file  Social Connections: Not on file    Review of Systems Per HPI  Objective:  BP (!) 146/100   Pulse 99   Ht 5' 6.75" (1.695 m)   Wt 187 lb (84.8 kg)   SpO2 97%   BMI 29.51 kg/m      06/29/2022    3:32 PM 06/29/2022    3:06 PM 02/24/2022    9:04 AM  BP/Weight  Systolic BP 123456 [id number]  Diastolic BP 123XX123 [id number] 82  Wt. (Lbs)  187 181.1  BMI  29.51 kg/m2 28.58 kg/m2    Physical Exam Vitals  and nursing note reviewed.  Constitutional:      General: He is not in acute distress.    Appearance: Normal appearance.  Neck:     Comments: No appreciable mass on exam. Cardiovascular:     Rate and Rhythm: Normal rate and regular rhythm.  Pulmonary:     Effort: Pulmonary effort is normal.     Breath sounds: Normal breath sounds. No wheezing or rales.  Neurological:     Mental Status: He is alert.  Psychiatric:        Mood and Affect: Mood normal.        Behavior: Behavior normal.     Lab Results  Component Value Date   WBC 8.2 02/17/2022   HGB 17.3 02/17/2022   HCT 50.0 02/17/2022   PLT 237 02/17/2022   GLUCOSE 101 (H) 02/17/2022   CHOL 187 02/17/2022   TRIG 153 (H) 02/17/2022   HDL 49 02/17/2022   LDLCALC 111 (H) 02/17/2022   ALT 31 02/17/2022   AST 29 02/17/2022   NA 142 02/17/2022   K 4.6 02/17/2022   CL 103 02/17/2022   CREATININE 1.12 02/17/2022  BUN 14 02/17/2022   CO2 23 02/17/2022   PSA 3.71 09/13/2012   HGBA1C 5.4 03/26/2021     Assessment & Plan:   Problem List Items Addressed This Visit       Endocrine   Thyroid nodule - Primary    Likely benign.  Ultrasound for further evaluation.      Relevant Orders   US THYROID    Follow-up:  Pending [name]  Hanalei Glace DO [place]

## 2022-06-29 NOTE — Assessment & Plan Note (Signed)
Likely benign.  Ultrasound for further evaluation.

## 2022-06-29 NOTE — Patient Instructions (Signed)
We will call and schedule the [name].  I'm not concerned/worried at this time. Likely benign.  Take care  Dr. Lacinda Axon

## 2022-06-30 DIAGNOSIS — H52223 Regular astigmatism, bilateral: Secondary | ICD-10-CM | POA: Diagnosis not present

## 2022-07-07 ENCOUNTER — Ambulatory Visit (HOSPITAL_COMMUNITY)
Admission: RE | Admit: 2022-07-07 | Discharge: 2022-07-07 | Disposition: A | Discharge status: Home / Self Care | Payer: Medicare HMO | Source: Ambulatory Visit | Attending: Family Medicine | Admitting: Family Medicine

## 2022-07-07 DIAGNOSIS — E041 Nontoxic single thyroid nodule: Secondary | ICD-10-CM | POA: Diagnosis not present

## 2022-07-07 DIAGNOSIS — E042 Nontoxic multinodular goiter: Secondary | ICD-10-CM | POA: Diagnosis not present

## 2022-07-08 ENCOUNTER — Other Ambulatory Visit (HOSPITAL_COMMUNITY): Payer: Medicare HMO

## 2022-07-09 NOTE — Addendum Note (Signed)
Addended by: Dairl Ponder on: 07/09/2022 03:39 PM   Modules accepted: Orders

## 2022-07-13 ENCOUNTER — Other Ambulatory Visit: Payer: Self-pay | Admitting: *Deleted

## 2022-07-13 DIAGNOSIS — E041 Nontoxic single thyroid nodule: Secondary | ICD-10-CM

## 2022-07-14 ENCOUNTER — Other Ambulatory Visit: Payer: Self-pay | Admitting: Family Medicine

## 2022-07-14 DIAGNOSIS — M62830 Muscle spasm of back: Secondary | ICD-10-CM | POA: Diagnosis not present

## 2022-07-14 DIAGNOSIS — M542 Cervicalgia: Secondary | ICD-10-CM | POA: Diagnosis not present

## 2022-07-14 DIAGNOSIS — M9902 Segmental and somatic dysfunction of thoracic region: Secondary | ICD-10-CM | POA: Diagnosis not present

## 2022-07-14 DIAGNOSIS — M546 Pain in thoracic spine: Secondary | ICD-10-CM | POA: Diagnosis not present

## 2022-07-14 DIAGNOSIS — M9901 Segmental and somatic dysfunction of cervical region: Secondary | ICD-10-CM | POA: Diagnosis not present

## 2022-07-14 DIAGNOSIS — E041 Nontoxic single thyroid nodule: Secondary | ICD-10-CM

## 2022-07-14 DIAGNOSIS — M9903 Segmental and somatic dysfunction of lumbar region: Secondary | ICD-10-CM | POA: Diagnosis not present

## 2022-07-22 ENCOUNTER — Ambulatory Visit (HOSPITAL_COMMUNITY)
Admission: RE | Admit: 2022-07-22 | Discharge: 2022-07-22 | Disposition: A | Discharge status: Home / Self Care | Payer: Medicare HMO | Source: Ambulatory Visit | Attending: Family Medicine | Admitting: Family Medicine

## 2022-07-22 ENCOUNTER — Encounter (HOSPITAL_COMMUNITY): Payer: Self-pay

## 2022-07-22 DIAGNOSIS — E041 Nontoxic single thyroid nodule: Secondary | ICD-10-CM | POA: Insufficient documentation

## 2022-07-22 DIAGNOSIS — Z8546 Personal history of malignant neoplasm of prostate: Secondary | ICD-10-CM | POA: Diagnosis not present

## 2022-07-22 MED ORDER — LIDOCAINE HCL (PF) 2 % IJ SOLN
INTRAMUSCULAR | Status: AC
  Administered 2022-07-22: 10 mL
  Filled 2022-07-22: qty 10

## 2022-07-25 LAB — CYTOLOGY - NON PAP

## 2022-08-03 ENCOUNTER — Other Ambulatory Visit: Payer: Self-pay | Admitting: Family Medicine

## 2022-08-03 DIAGNOSIS — I10 Essential (primary) hypertension: Secondary | ICD-10-CM

## 2022-08-12 DIAGNOSIS — R9721 Rising PSA following treatment for malignant neoplasm of prostate: Secondary | ICD-10-CM | POA: Diagnosis not present

## 2022-08-12 DIAGNOSIS — C61 Malignant neoplasm of prostate: Secondary | ICD-10-CM | POA: Diagnosis not present

## 2022-08-23 DIAGNOSIS — M5442 Lumbago with sciatica, left side: Secondary | ICD-10-CM | POA: Diagnosis not present

## 2022-08-23 DIAGNOSIS — M9903 Segmental and somatic dysfunction of lumbar region: Secondary | ICD-10-CM | POA: Diagnosis not present

## 2022-08-23 DIAGNOSIS — M9905 Segmental and somatic dysfunction of pelvic region: Secondary | ICD-10-CM | POA: Diagnosis not present

## 2022-08-23 DIAGNOSIS — M9902 Segmental and somatic dysfunction of thoracic region: Secondary | ICD-10-CM | POA: Diagnosis not present

## 2022-08-27 DIAGNOSIS — M5442 Lumbago with sciatica, left side: Secondary | ICD-10-CM | POA: Diagnosis not present

## 2022-08-27 DIAGNOSIS — M9903 Segmental and somatic dysfunction of lumbar region: Secondary | ICD-10-CM | POA: Diagnosis not present

## 2022-08-27 DIAGNOSIS — M9905 Segmental and somatic dysfunction of pelvic region: Secondary | ICD-10-CM | POA: Diagnosis not present

## 2022-08-27 DIAGNOSIS — M9902 Segmental and somatic dysfunction of thoracic region: Secondary | ICD-10-CM | POA: Diagnosis not present

## 2022-09-07 ENCOUNTER — Ambulatory Visit (INDEPENDENT_AMBULATORY_CARE_PROVIDER_SITE_OTHER): Payer: Medicare HMO | Admitting: Family Medicine

## 2022-09-07 ENCOUNTER — Encounter: Payer: Self-pay | Admitting: Family Medicine

## 2022-09-07 VITALS — BP 154/101 | HR 70 | Temp 97.7°F | Ht 66.75 in | Wt 181.0 lb

## 2022-09-07 DIAGNOSIS — R1013 Epigastric pain: Secondary | ICD-10-CM | POA: Insufficient documentation

## 2022-09-07 MED ORDER — PANTOPRAZOLE SODIUM 40 MG PO TBEC: 40.0000 mg | DELAYED_RELEASE_TABLET | Freq: Every day | ORAL | 3 refills | Status: DC

## 2022-09-07 NOTE — Progress Notes (Signed)
Subjective:  Patient ID: Victor Patel, male    DOB: 04-02-1956  Age: 67 y.o. MRN: [id number]  CC: Chief Complaint  Patient presents with   lowe abd pain    Has been experiencing belching and abd swelling and pain Had been eating a large amount of fresh veggies  Changes diet to gluten free and is doing better on blander foods    HPI:  67 year old male presents for evaluation of the above.  Started 2 and half weeks ago.  He reports abdominal bloating, belching, and periumbilical abdominal pain.  He states that it started after he ate a lot of raw vegetables.  He has changed his diet and eliminated lots of things and seem to be improving.  However, he still does not feel like he is quite back to his normal self.  No diarrhea.  No fever.  He has had some constipation.  Bowel movements are returning back to normal.  Patient Active Problem List   Diagnosis Date Noted   Dyspepsia 09/07/2022   Thyroid nodule 06/29/2022   Annual physical exam 02/25/2022   Essential hypertension 04/23/2021   Prostate cancer ([facility]) 02/05/2019   Hyperlipidemia 10/09/2012    Social Hx   Social History   Socioeconomic History   Marital status: Single    Spouse name: Not on file   Number of children: Not on file   Years of education: Not on file   Highest education level: Not on file  Occupational History   Not on file  Tobacco Use   Smoking status: Never   Smokeless tobacco: Never  Vaping Use   Vaping Use: Never used  Substance and Sexual Activity   Alcohol use: No   Drug use: No   Sexual activity: Not on file  Other Topics Concern   Not on file  Social History Narrative   Not on file   Social Determinants of Health   Financial Resource Strain: Not on file  Food Insecurity: Not on file  Transportation Needs: Not on file  Physical Activity: Not on file  Stress: Not on file  Social Connections: Not on file    Review of Systems Per HPI  Objective:  BP (!) 154/101   Pulse 70    Temp 97.7 F (36.5 C)   Ht 5' 6.75" (1.695 m)   Wt 181 lb (82.1 kg)   SpO2 95%   BMI 28.56 kg/m      09/07/2022    3:27 PM 07/22/2022    9:00 AM 06/29/2022    3:32 PM  BP/Weight  Systolic BP 154 157 146  Diastolic BP 101 100 100  Wt. (Lbs) 181    BMI 28.56 kg/m2      Physical Exam Vitals and nursing note reviewed.  Constitutional:      General: He is not in acute distress.    Appearance: Normal appearance.  HENT:     Head: Normocephalic and atraumatic.  Cardiovascular:     Rate and Rhythm: Normal rate and regular rhythm.  Pulmonary:     Effort: Pulmonary effort is normal.     Breath sounds: Normal breath sounds.  Abdominal:     General: There is no distension.     Palpations: Abdomen is soft.     Tenderness: There is no abdominal tenderness.  Neurological:     Mental Status: He is alert.  Psychiatric:        Mood and Affect: Mood normal.        Behavior: Behavior  normal.     Lab Results  Component Value Date   WBC 8.2 02/17/2022   HGB 17.3 02/17/2022   HCT 50.0 02/17/2022   PLT 237 02/17/2022   GLUCOSE 101 (H) 02/17/2022   CHOL 187 02/17/2022   TRIG 153 (H) 02/17/2022   HDL 49 02/17/2022   LDLCALC 111 (H) 02/17/2022   ALT 31 02/17/2022   AST 29 02/17/2022   NA 142 02/17/2022   K 4.6 02/17/2022   CL 103 02/17/2022   CREATININE 1.12 02/17/2022   BUN 14 02/17/2022   CO2 23 02/17/2022   PSA 3.71 09/13/2012   HGBA1C 5.4 03/26/2021     Assessment & Plan:   Problem List Items Addressed This Visit       Other   Dyspepsia - Primary    Benign exam today.  Placing on Protonix.  Patient can start reintroducing previous food items.  Low FODMAP diet.       Meds ordered this encounter  Medications   pantoprazole (PROTONIX) 40 MG tablet    Sig: Take 1 tablet (40 mg total) by mouth daily.    Dispense:  30 tablet    Refill:  3   Laterica Matarazzo DO [facility]

## 2022-09-07 NOTE — Assessment & Plan Note (Signed)
Benign exam today.  Placing on Protonix.  Patient can start reintroducing previous food items.  Low FODMAP diet.

## 2022-11-05 ENCOUNTER — Other Ambulatory Visit: Payer: Self-pay | Admitting: Family Medicine

## 2022-11-05 DIAGNOSIS — I10 Essential (primary) hypertension: Secondary | ICD-10-CM

## 2022-11-10 DIAGNOSIS — R9721 Rising PSA following treatment for malignant neoplasm of prostate: Secondary | ICD-10-CM | POA: Diagnosis not present

## 2022-11-10 DIAGNOSIS — C61 Malignant neoplasm of prostate: Secondary | ICD-10-CM | POA: Diagnosis not present

## 2022-12-06 DIAGNOSIS — Z1283 Encounter for screening for malignant neoplasm of skin: Secondary | ICD-10-CM | POA: Diagnosis not present

## 2022-12-06 DIAGNOSIS — X32XXXD Exposure to sunlight, subsequent encounter: Secondary | ICD-10-CM | POA: Diagnosis not present

## 2022-12-06 DIAGNOSIS — D225 Melanocytic nevi of trunk: Secondary | ICD-10-CM | POA: Diagnosis not present

## 2022-12-06 DIAGNOSIS — L570 Actinic keratosis: Secondary | ICD-10-CM | POA: Diagnosis not present

## 2022-12-06 DIAGNOSIS — Z85820 Personal history of malignant melanoma of skin: Secondary | ICD-10-CM | POA: Diagnosis not present

## 2022-12-06 DIAGNOSIS — Z08 Encounter for follow-up examination after completed treatment for malignant neoplasm: Secondary | ICD-10-CM | POA: Diagnosis not present

## 2022-12-24 ENCOUNTER — Ambulatory Visit (INDEPENDENT_AMBULATORY_CARE_PROVIDER_SITE_OTHER): Payer: Medicare HMO

## 2022-12-24 VITALS — Ht 69.0 in | Wt 178.0 lb

## 2022-12-24 DIAGNOSIS — Z0000 Encounter for general adult medical examination without abnormal findings: Secondary | ICD-10-CM | POA: Diagnosis not present

## 2022-12-24 NOTE — Patient Instructions (Signed)
Victor Patel , Thank you for taking time to come for your Medicare Wellness Visit. I appreciate your ongoing commitment to your health goals. Please review the following plan we discussed and let me know if I can assist you in the future.   These are the goals we discussed:  Goals   None     This is a list of the screening recommended for you and due dates:  Health Maintenance  Topic Date Due   DTaP/Tdap/Td vaccine (1 - Tdap) Never done   Flu Shot  12/09/2022   Pneumonia Vaccine (1 of 1 - PCV) 02/26/2023*   Medicare Annual Wellness Visit  12/24/2023   Colon Cancer Screening  03/05/2027   Zoster (Shingles) Vaccine  Completed   HPV Vaccine  Aged Out   COVID-19 Vaccine  Discontinued   Hepatitis C Screening  Discontinued  *Topic was postponed. The date shown is not the original due date.    Advanced directives: Advance directive discussed with you today. Even though you declined this today, please call our office should you change your mind, and we can give you the proper paperwork for you to fill out. Advance care planning is a way to make decisions about medical care that fits your values in case you are ever unable to make these decisions for yourself.  Information on Advanced Care Planning can be found at [facility] Advance Health Care Directives Advance Health Care Directives ([email or web address]    Conditions/risks identified:  You are due for the vaccines checked below. You may have these done at your preferred pharmacy. Please have them fax the office proof of the vaccines so that we can update your chart.   [x]  Flu (due annually) []  Shingrix (Shingles vaccine) []  Pneumonia Vaccines [x]  TDAP (Tetanus) Vaccine every 10 years []  Covid-19    Next appointment: VIRTUAL/ TELEPHONE VISIT Follow up in one year for your annual wellness visit  December 30, 2023 at 8:00 am telephone visit  HAPPY RETIREMENT!!!!!   Preventive Care 65 Years and Older, Male  Preventive  care refers to lifestyle choices and visits with your health care provider that can promote health and wellness. What does preventive care include? A yearly physical exam. This is also called an annual well check. Dental exams once or twice a year. Routine eye exams. Ask your health care provider how often you should have your eyes checked. Personal lifestyle choices, including: Daily care of your teeth and gums. Regular physical activity. Eating a healthy diet. Avoiding tobacco and drug use. Limiting alcohol use. Practicing safe sex. Taking low doses of aspirin every day. Taking vitamin and mineral supplements as recommended by your health care provider. What happens during an annual well check? The services and screenings done by your health care provider during your annual well check will depend on your age, overall health, lifestyle risk factors, and family history of disease. Counseling  Your health care provider may ask you questions about your: Alcohol use. Tobacco use. Drug use. Emotional well-being. Home and relationship well-being. Sexual activity. Eating habits. History of falls. Memory and ability to understand (cognition). Work and work Astronomer. Screening  You may have the following tests or measurements: Height, weight, and BMI. Blood pressure. Lipid and cholesterol levels. These may be checked every 5 years, or more frequently if you are over 32 years old. Skin check. Lung cancer screening. You may have this screening every year starting at age 18 if you have a 30-pack-year history of smoking  and currently smoke or have quit within the past 15 years. Fecal occult blood test (FOBT) of the stool. You may have this test every year starting at age 16. Flexible sigmoidoscopy or colonoscopy. You may have a sigmoidoscopy every 5 years or a colonoscopy every 10 years starting at age 4. Prostate cancer screening. Recommendations will vary depending on your family  history and other risks. Hepatitis C blood test. Hepatitis B blood test. Sexually transmitted disease (STD) testing. Diabetes screening. This is done by checking your blood sugar (glucose) after you have not eaten for a while (fasting). You may have this done every 1-3 years. Abdominal aortic aneurysm (AAA) screening. You may need this if you are a current or former smoker. Osteoporosis. You may be screened starting at age 71 if you are at high risk. Talk with your health care provider about your test results, treatment options, and if necessary, the need for more tests. Vaccines  Your health care provider may recommend certain vaccines, such as: Influenza vaccine. This is recommended every year. Tetanus, diphtheria, and acellular pertussis (Tdap, Td) vaccine. You may need a Td booster every 10 years. Zoster vaccine. You may need this after age 30. Pneumococcal 13-valent conjugate (PCV13) vaccine. One dose is recommended after age 28. Pneumococcal polysaccharide (PPSV23) vaccine. One dose is recommended after age 31. Talk to your health care provider about which screenings and vaccines you need and how often you need them. This information is not intended to replace advice given to you by your health care provider. Make sure you discuss any questions you have with your health care provider. Document Released: 05/23/2015 Document Revised: 01/14/2016 Document Reviewed: 02/25/2015 Elsevier Interactive Patient Education  2017 [employer].  Fall Prevention in the Home Falls can cause injuries. They can happen to people of all ages. There are many things you can do to make your home safe and to help prevent falls. What can I do on the outside of my home? Regularly fix the edges of walkways and driveways and fix any cracks. Remove anything that might make you trip as you walk through a door, such as a raised step or threshold. Trim any bushes or trees on the path to your home. Use bright outdoor  lighting. Clear any walking paths of anything that might make someone trip, such as rocks or tools. Regularly check to see if handrails are loose or broken. Make sure that both sides of any steps have handrails. Any raised decks and porches should have guardrails on the edges. Have any leaves, snow, or ice cleared regularly. Use sand or salt on walking paths during winter. Clean up any spills in your garage right away. This includes oil or grease spills. What can I do in the bathroom? Use night lights. Install grab bars by the toilet and in the tub and shower. Do not use towel bars as grab bars. Use non-skid mats or decals in the tub or shower. If you need to sit down in the shower, use a plastic, non-slip stool. Keep the floor dry. Clean up any water that spills on the floor as soon as it happens. Remove soap buildup in the tub or shower regularly. Attach bath mats securely with double-sided non-slip rug tape. Do not have throw rugs and other things on the floor that can make you trip. What can I do in the bedroom? Use night lights. Make sure that you have a light by your bed that is easy to reach. Do not use any  sheets or blankets that are too big for your bed. They should not hang down onto the floor. Have a firm chair that has side arms. You can use this for support while you get dressed. Do not have throw rugs and other things on the floor that can make you trip. What can I do in the kitchen? Clean up any spills right away. Avoid walking on wet floors. Keep items that you use a lot in easy-to-reach places. If you need to reach something above you, use a strong step stool that has a grab bar. Keep electrical cords out of the way. Do not use floor polish or wax that makes floors slippery. If you must use wax, use non-skid floor wax. Do not have throw rugs and other things on the floor that can make you trip. What can I do with my stairs? Do not leave any items on the stairs. Make  sure that there are handrails on both sides of the stairs and use them. Fix handrails that are broken or loose. Make sure that handrails are as long as the stairways. Check any carpeting to make sure that it is firmly attached to the stairs. Fix any carpet that is loose or worn. Avoid having throw rugs at the top or bottom of the stairs. If you do have throw rugs, attach them to the floor with carpet tape. Make sure that you have a light switch at the top of the stairs and the bottom of the stairs. If you do not have them, ask someone to add them for you. What else can I do to help prevent falls? Wear shoes that: Do not have high heels. Have rubber bottoms. Are comfortable and fit you well. Are closed at the toe. Do not wear sandals. If you use a stepladder: Make sure that it is fully opened. Do not climb a closed stepladder. Make sure that both sides of the stepladder are locked into place. Ask someone to hold it for you, if possible. Clearly mark and make sure that you can see: Any grab bars or handrails. First and last steps. Where the edge of each step is. Use tools that help you move around (mobility aids) if they are needed. These include: Canes. Walkers. Scooters. Crutches. Turn on the lights when you go into a dark area. Replace any light bulbs as soon as they burn out. Set up your furniture so you have a clear path. Avoid moving your furniture around. If any of your floors are uneven, fix them. If there are any pets around you, be aware of where they are. Review your medicines with your doctor. Some medicines can make you feel dizzy. This can increase your chance of falling. Ask your doctor what other things that you can do to help prevent falls. This information is not intended to replace advice given to you by your health care provider. Make sure you discuss any questions you have with your health care provider. Document Released: 02/20/2009 Document Revised: 10/02/2015  Document Reviewed: 05/31/2014 Elsevier Interactive Patient Education  2017 [employer].

## 2022-12-24 NOTE — Progress Notes (Signed)
 Because this visit was a virtual/telehealth visit,  certain criteria was not obtained, such a blood pressure, CBG if patient is a diabetic, and timed get up and go. Any medications not marked as "taking" were not mentioned during the medication reconciliation part of the visit. Any vitals not documented were not able to be obtained due to this being a telehealth visit. Vitals that have been documented are verbally provided by the patient.  Patient was unable to self-report a recent blood pressure reading due to a lack of equipment at home via telehealth.   Subjective:   Victor Patel is a 67 y.o. male who presents for an Initial Medicare Annual Wellness Visit.  Visit Complete: Virtual  I connected with  Lucila Maine on 12/24/22 by a audio enabled telemedicine application and verified that I am speaking with the correct person using two identifiers.  Patient Location: Home  Provider Location: Home Office  I discussed the limitations of evaluation and management by telemedicine. The patient expressed understanding and agreed to proceed.  Patient Medicare AWV questionnaire was completed by the patient on na; I have confirmed that all information answered by patient is correct and no changes since this date.  Review of Systems     Cardiac Risk Factors include: advanced age (>34men, >72 women);dyslipidemia;hypertension;male gender     Objective:    Today's Vitals   12/24/22 0803  Weight: 178 lb (80.7 kg)  Height: 5\' 9"  (1.753 m)   Body mass index is 26.29 kg/m.     12/24/2022    8:06 AM 03/04/2017    6:46 AM  Advanced Directives  Does Patient Have a Medical Advance Directive? No No  Would patient like information on creating a medical advance directive? No - Patient declined No - Patient declined    Current Medications (verified) Outpatient Encounter Medications as of 12/24/2022  Medication Sig   rosuvastatin (CRESTOR) 10 MG tablet TAKE ONE TABLET BY MOUTH ONCE DAILY.    valsartan (DIOVAN) 80 MG tablet TAKE ONE TABLET BY MOUTH ONCE DAILY.   pantoprazole (PROTONIX) 40 MG tablet Take 1 tablet (40 mg total) by mouth daily. (Patient not taking: Reported on 12/24/2022)   No facility-administered encounter medications on file as of 12/24/2022.    Allergies (verified) Lipitor [atorvastatin]   History: Past Medical History:  Diagnosis Date   Hyperlipidemia    Hypertension    Past Surgical History:  Procedure Laterality Date   CARDIAC SURGERY     COLONOSCOPY  2008   COLONOSCOPY N/A 03/04/2017   Procedure: COLONOSCOPY;  Surgeon: Corbin Ade, MD;  Location: [facility];  Service: Endoscopy;  Laterality: N/A;  7:30 Am   HERNIA REPAIR Right [id number]   History reviewed. No pertinent family history. Social History   Socioeconomic History   Marital status: Single    Spouse name: Not on file   Number of children: Not on file   Years of education: Not on file   Highest education level: Not on file  Occupational History   Not on file  Tobacco Use   Smoking status: Never   Smokeless tobacco: Never  Vaping Use   Vaping status: Never Used  Substance and Sexual Activity   Alcohol use: No   Drug use: No   Sexual activity: Not on file  Other Topics Concern   Not on file  Social History Narrative   Not on file   Social Determinants of Health   Financial Resource Strain: Low Risk  (12/24/2022)   Overall  Financial Resource Strain (CARDIA)    Difficulty of Paying Living Expenses: Not hard at all  Food Insecurity: No Food Insecurity (12/24/2022)   Hunger Vital Sign    Worried About Running Out of Food in the Last Year: Never true    Ran Out of Food in the Last Year: Never true  Transportation Needs: No Transportation Needs (12/24/2022)   PRAPARE - Administrator, Civil Service (Medical): No    Lack of Transportation (Non-Medical): No  Physical Activity: Sufficiently Active (12/24/2022)   Exercise Vital Sign    Days of Exercise per Week: 7  days    Minutes of Exercise per Session: 30 min  Stress: No Stress Concern Present (12/24/2022)   [employer] of Occupational Health - Occupational Stress Questionnaire    Feeling of Stress : Not at all  Social Connections: Moderately Integrated (12/24/2022)   Social Connection and Isolation Panel [NHANES]    Frequency of Communication with Friends and Family: More than three times a week    Frequency of Social Gatherings with Friends and Family: More than three times a week    Attends Religious Services: More than 4 times per year    Active Member of [employer] or Organizations: No    Attends Engineer, structural: Never    Marital Status: Married    Tobacco Counseling Counseling given: Yes   Clinical Intake:  Pre-visit preparation completed: Yes  Pain : No/denies pain     BMI - recorded: 26.29 Nutritional Status: BMI 25 -29 Overweight Nutritional Risks: None Diabetes: No  How often do you need to have someone help you when you read instructions, pamphlets, or other written materials from your doctor or pharmacy?: 1 - Never  Interpreter Needed?: No  Information entered by ::  Stephane Junkins,CMA   Activities of Daily Living    12/24/2022    8:05 AM  In your present state of health, do you have any difficulty performing the following activities:  Hearing? 0  Vision? 0  Difficulty concentrating or making decisions? 0  Walking or climbing stairs? 0  Dressing or bathing? 0  Doing errands, shopping? 0  Preparing Food and eating ? N  Using the Toilet? N  In the past six months, have you accidently leaked urine? N  Do you have problems with loss of bowel control? N  Managing your Medications? N  Managing your Finances? N  Housekeeping or managing your Housekeeping? N    Patient Care Team: Tommie Sams, DO as PCP - General (Family Medicine)  Indicate any recent Medical Services you may have received from other than [facility] providers in the past year (date may be  approximate).     Assessment:   This is a routine wellness examination for Victor Patel.  Hearing/Vision screen Hearing Screening - Comments:: Patient denies any hearing difficulties.   Vision Screening - Comments:: Patient wears contacts and is up to date with yearly eye exams with Dr[facility] Cl[facility]   Dietary issues and exercise activities discussed:     Goals Addressed   None    Depression Screen    12/24/2022    8:07 AM 06/29/2022    3:08 PM 02/24/2022    9:03 AM 03/19/2020    9:36 AM 02/05/2019    9:52 AM 10/25/2017    8:43 AM  PHQ 2/9 Scores  PHQ - 2 Score 0 0 0 0 0 0  PHQ- 9 Score 0 0  Fall Risk    12/24/2022    8:06 AM 02/24/2022    9:03 AM 03/19/2020    9:35 AM 10/25/2017    8:43 AM  Fall Risk   Falls in the past year? 0 0 0 No  Number falls in past yr: 0 0    Injury with Fall? 0 0    Risk for fall due to : No Fall Risks No Fall Risks    Follow up Falls prevention discussed Falls evaluation completed Falls evaluation completed     MEDICARE RISK AT HOME:  Medicare Risk at Home - 12/24/22 0806     Any stairs in or around the home? No    If so, are there any without handrails? No    Home free of loose throw rugs in walkways, pet beds, electrical cords, etc? Yes    Adequate lighting in your home to reduce risk of falls? Yes    Life alert? No    Use of a cane, walker or w/c? No    Grab bars in the bathroom? No    Shower chair or bench in shower? Yes    Elevated toilet seat or a handicapped toilet? No             TIMED UP AND GO:  Was the test performed? No    Cognitive Function:        12/24/2022    8:07 AM  6CIT Screen  What Year? 0 points  What month? 0 points  What time? 0 points  Count back from 20 0 points  Months in reverse 0 points  Repeat phrase 0 points  Total Score 0 points    Immunizations Immunization History  Administered Date(s) Administered   Influenza,inj,Quad PF,6+ Mos 04/23/2021   Influenza-Unspecified  02/08/2013, 02/07/2014, 02/12/2015   Moderna Sars-Covid-2 Vaccination 07/27/2019, 08/29/2019, 05/16/2020   Zoster Recombinant(Shingrix) 02/08/2019, 06/04/2019    TDAP status: Due, Education has been provided regarding the importance of this vaccine. Advised may receive this vaccine at local pharmacy or Health Dept. Aware to provide a copy of the vaccination record if obtained from local pharmacy or Health Dept. Verbalized acceptance and understanding.  Flu Vaccine status: Due, Education has been provided regarding the importance of this vaccine. Advised may receive this vaccine at local pharmacy or Health Dept. Aware to provide a copy of the vaccination record if obtained from local pharmacy or Health Dept. Verbalized acceptance and understanding.  Pneumococcal vaccine status: Declined,  Education has been provided regarding the importance of this vaccine but patient still declined. Advised may receive this vaccine at local pharmacy or Health Dept. Aware to provide a copy of the vaccination record if obtained from local pharmacy or Health Dept. Verbalized acceptance and understanding.   Covid-19 vaccine status: Information provided on how to obtain vaccines.   Qualifies for Shingles Vaccine? Yes   Zostavax completed No   Shingrix Completed?: Yes  Screening Tests Health Maintenance  Topic Date Due   Medicare Annual Wellness (AWV)  Never done   DTaP/Tdap/Td (1 - Tdap) Never done   INFLUENZA VACCINE  12/09/2022   Pneumonia Vaccine 1+ Years old (1 of 1 - PCV) 02/26/2023 (Originally 12/23/2021)   Colonoscopy  03/05/2027   Zoster Vaccines- Shingrix  Completed   HPV VACCINES  Aged Out   COVID-19 Vaccine  Discontinued   Hepatitis C Screening  Discontinued    Health Maintenance  Health Maintenance Due  Topic Date Due   Medicare Annual Wellness (AWV)  Never done  DTaP/Tdap/Td (1 - Tdap) Never done   INFLUENZA VACCINE  12/09/2022    Colorectal cancer screening: Type of screening:  Colonoscopy. Completed 10/26/201810. Repeat every 10 years  Lung Cancer Screening: (Low Dose CT Chest recommended if Age 16-80 years, 20 pack-year currently smoking OR have quit w/in 15years.) does not qualify.    Additional Screening:  Hepatitis C Screening: does not qualify; patient declined  Vision Screening: Recommended annual ophthalmology exams for early detection of glaucoma and other disorders of the eye. Is the patient up to date with their annual eye exam?  Yes  Who is the provider or what is the name of the office in which the patient attends annual eye exams? Dr. Clista [facility] in [facility] If pt is not established with a provider, would they like to be referred to a provider to establish care? No .   Dental Screening: Recommended annual dental exams for proper oral hygiene  Diabetic Foot Exam: na  Community Resource Referral / Chronic Care Management: CRR required this visit?  No   CCM required this visit?  No    Plan:     I have personally reviewed and noted the following in the patient's chart:   Medical and social history Use of alcohol, tobacco or illicit drugs  Current medications and supplements including opioid prescriptions. Patient is not currently taking opioid prescriptions. Functional ability and status Nutritional status Physical activity Advanced directives List of other physicians Hospitalizations, surgeries, and ER visits in previous 12 months Vitals Screenings to include cognitive, depression, and falls Referrals and appointments  In addition, I have reviewed and discussed with patient certain preventive protocols, quality metrics, and best practice recommendations. A written personalized care plan for preventive services as well as general preventive health recommendations were provided to patient.     Jordan Hawks Letha Mirabal, CMA   12/24/2022   After Visit Summary: (Mail) Due to this being a telephonic visit, the after visit summary with patients  personalized plan was offered to patient via mail   Nurse Notes:

## 2022-12-31 ENCOUNTER — Ambulatory Visit (INDEPENDENT_AMBULATORY_CARE_PROVIDER_SITE_OTHER): Payer: Medicare HMO | Admitting: Family Medicine

## 2022-12-31 VITALS — BP 138/82 | HR 100 | Wt 175.0 lb

## 2022-12-31 DIAGNOSIS — E782 Mixed hyperlipidemia: Secondary | ICD-10-CM | POA: Diagnosis not present

## 2022-12-31 DIAGNOSIS — I10 Essential (primary) hypertension: Secondary | ICD-10-CM

## 2022-12-31 DIAGNOSIS — E041 Nontoxic single thyroid nodule: Secondary | ICD-10-CM | POA: Diagnosis not present

## 2022-12-31 DIAGNOSIS — R1013 Epigastric pain: Secondary | ICD-10-CM

## 2022-12-31 DIAGNOSIS — R197 Diarrhea, unspecified: Secondary | ICD-10-CM

## 2022-12-31 MED ORDER — PANTOPRAZOLE SODIUM 40 MG PO TBEC
40.0000 mg | DELAYED_RELEASE_TABLET | Freq: Two times a day (BID) | ORAL | 1 refills | Status: DC
Start: 2022-12-31 — End: 2023-08-01

## 2022-12-31 NOTE — Patient Instructions (Signed)
Protonix twice daily before a meal.  Labs today. Get vial for stool sample.  Follow up in 1 month.  Take care  Dr. Adriana Simas

## 2023-01-01 LAB — CBC WITH DIFFERENTIAL/PLATELET
Basophils Absolute: 0 10*3/uL (ref 0.0–0.2)
Basos: 1 %
EOS (ABSOLUTE): 0.1 10*3/uL (ref 0.0–0.4)
Eos: 1 %
Hematocrit: 49 % (ref 37.5–51.0)
Hemoglobin: 16.8 g/dL (ref 13.0–17.7)
Immature Grans (Abs): 0.1 10*3/uL (ref 0.0–0.1)
Immature Granulocytes: 1 %
Lymphocytes Absolute: 1.4 10*3/uL (ref 0.7–3.1)
Lymphs: 26 %
MCH: 29.3 pg (ref 26.6–33.0)
MCHC: 34.3 g/dL (ref 31.5–35.7)
MCV: 86 fL (ref 79–97)
Monocytes Absolute: 0.8 10*3/uL (ref 0.1–0.9)
Monocytes: 15 %
Neutrophils Absolute: 3 10*3/uL (ref 1.4–7.0)
Neutrophils: 56 %
Platelets: 202 10*3/uL (ref 150–450)
RBC: 5.73 x10E6/uL (ref 4.14–5.80)
RDW: 12.2 % (ref 11.6–15.4)
WBC: 5.3 10*3/uL (ref 3.4–10.8)

## 2023-01-01 LAB — LIPID PANEL
Chol/HDL Ratio: 4.2 ratio (ref 0.0–5.0)
Cholesterol, Total: 139 mg/dL (ref 100–199)
HDL: 33 mg/dL — ABNORMAL LOW (ref >39)
LDL Chol Calc (NIH): 80 mg/dL (ref 0–99)
Triglycerides: 150 mg/dL — ABNORMAL HIGH (ref 0–149)
VLDL Cholesterol Cal: 26 mg/dL (ref 5–40)

## 2023-01-01 LAB — CMP14+EGFR
ALT: 35 IU/L (ref 0–44)
AST: 42 IU/L — ABNORMAL HIGH (ref 0–40)
Albumin: 4.4 g/dL (ref 3.9–4.9)
Alkaline Phosphatase: 78 IU/L (ref 44–121)
BUN/Creatinine Ratio: 10 (ref 10–24)
BUN: 9 mg/dL (ref 8–27)
Bilirubin Total: 0.5 mg/dL (ref 0.0–1.2)
CO2: 28 mmol/L (ref 20–29)
Calcium: 9.2 mg/dL (ref 8.6–10.2)
Chloride: 102 mmol/L (ref 96–106)
Creatinine, Ser: 0.89 mg/dL (ref 0.76–1.27)
Globulin, Total: 2.5 g/dL (ref 1.5–4.5)
Glucose: 106 mg/dL — ABNORMAL HIGH (ref 70–99)
Potassium: 4.3 mmol/L (ref 3.5–5.2)
Sodium: 144 mmol/L (ref 134–144)
Total Protein: 6.9 g/dL (ref 6.0–8.5)
eGFR: 95 mL/min/{1.73_m2} (ref >59)

## 2023-01-03 ENCOUNTER — Other Ambulatory Visit: Payer: Self-pay | Admitting: Family Medicine

## 2023-01-03 DIAGNOSIS — E782 Mixed hyperlipidemia: Secondary | ICD-10-CM | POA: Diagnosis not present

## 2023-01-03 DIAGNOSIS — R1013 Epigastric pain: Secondary | ICD-10-CM | POA: Diagnosis not present

## 2023-01-03 DIAGNOSIS — I10 Essential (primary) hypertension: Secondary | ICD-10-CM | POA: Diagnosis not present

## 2023-01-03 DIAGNOSIS — R197 Diarrhea, unspecified: Secondary | ICD-10-CM | POA: Diagnosis not present

## 2023-01-03 NOTE — Assessment & Plan Note (Signed)
Labs today for further evaluation.  Placing on Protonix.  Advised to take twice daily before meal.  Once labs and stool testing return, we will arrange for GI referral if needed.

## 2023-01-03 NOTE — Progress Notes (Signed)
Subjective:  Patient ID: Victor Patel, male    DOB: May 14, 1955  Age: 67 y.o. MRN: [id number]  CC: Abdominal pain/dyspepsia  HPI:  67 year old male presents with persistent symptoms.  Patient reports that he continues have difficulty with significant belching.  Has some upper abdominal pain in the epigastric region.  States that he has had loose stools that are light-colored.  He states that he has a lot of gas.  Patient states that he is under a lot of stress due to his work.  He is concerned that this is contributing.  He has not been taking Protonix.  No reports of weight loss.  No other associated symptoms.  No other complaints.  Patient Active Problem List   Diagnosis Date Noted   Epigastric abdominal pain 01/03/2023   Dyspepsia 09/07/2022   Thyroid nodule 06/29/2022   Annual physical exam 02/25/2022   Essential hypertension 04/23/2021   Prostate cancer (HCC) 02/05/2019   Hyperlipidemia 10/09/2012    Social Hx   Social History   Socioeconomic History   Marital status: Single    Spouse name: Not on file   Number of children: Not on file   Years of education: Not on file   Highest education level: Not on file  Occupational History   Not on file  Tobacco Use   Smoking status: Never   Smokeless tobacco: Never  Vaping Use   Vaping status: Never Used  Substance and Sexual Activity   Alcohol use: No   Drug use: No   Sexual activity: Not on file  Other Topics Concern   Not on file  Social History Narrative   Not on file   Social Determinants of Health   Financial Resource Strain: Low Risk  (12/24/2022)   Overall Financial Resource Strain (CARDIA)    Difficulty of Paying Living Expenses: Not hard at all  Food Insecurity: No Food Insecurity (12/24/2022)   Hunger Vital Sign    Worried About Running Out of Food in the Last Year: Never true    Ran Out of Food in the Last Year: Never true  Transportation Needs: No Transportation Needs (12/24/2022)   PRAPARE -  Administrator, Civil Service (Medical): No    Lack of Transportation (Non-Medical): No  Physical Activity: Sufficiently Active (12/24/2022)   Exercise Vital Sign    Days of Exercise per Week: 7 days    Minutes of Exercise per Session: 30 min  Stress: No Stress Concern Present (12/24/2022)   [employer] of Occupational Health - Occupational Stress Questionnaire    Feeling of Stress : Not at all  Social Connections: Moderately Integrated (12/24/2022)   Social Connection and Isolation Panel [NHANES]    Frequency of Communication with Friends and Family: More than three times a week    Frequency of Social Gatherings with Friends and Family: More than three times a week    Attends Religious Services: More than 4 times per year    Active Member of [employer] or Organizations: No    Attends Engineer, structural: Never    Marital Status: Married    Review of Systems Per HPI  Objective:  BP 138/82   Pulse 100   Wt 175 lb (79.4 kg)   SpO2 97%   BMI 25.84 kg/m      12/31/2022   10:01 AM 12/31/2022    9:29 AM 12/24/2022    8:03 AM  BP/Weight  Systolic BP 138 156 --  Diastolic BP 82 80 --  Wt. (Lbs)  175 178  BMI  25.84 kg/m2 26.29 kg/m2    Physical Exam Vitals and nursing note reviewed.  Constitutional:      General: He is not in acute distress.    Appearance: Normal appearance.  HENT:     Head: Normocephalic and atraumatic.  Cardiovascular:     Rate and Rhythm: Normal rate and regular rhythm.  Pulmonary:     Effort: Pulmonary effort is normal.     Breath sounds: Normal breath sounds.  Abdominal:     General: There is no distension.     Palpations: Abdomen is soft.     Tenderness: There is no abdominal tenderness.  Neurological:     Mental Status: He is alert.     Lab Results  Component Value Date   WBC 5.3 12/31/2022   HGB 16.8 12/31/2022   HCT 49.0 12/31/2022   PLT 202 12/31/2022   GLUCOSE 106 (H) 12/31/2022   CHOL 139 12/31/2022   TRIG  150 (H) 12/31/2022   HDL 33 (L) 12/31/2022   LDLCALC 80 12/31/2022   ALT 35 12/31/2022   AST 42 (H) 12/31/2022   NA 144 12/31/2022   K 4.3 12/31/2022   CL 102 12/31/2022   CREATININE 0.89 12/31/2022   BUN 9 12/31/2022   CO2 28 12/31/2022   PSA 3.71 09/13/2012   HGBA1C 5.4 03/26/2021     Assessment & Plan:   Problem List Items Addressed This Visit       Cardiovascular and Mediastinum   Essential hypertension   Relevant Orders   CMP14+EGFR (Completed)     Endocrine   Thyroid nodule     Other   Hyperlipidemia   Relevant Orders   Lipid panel (Completed)   Epigastric abdominal pain - Primary    Labs today for further evaluation.  Placing on Protonix.  Advised to take twice daily before meal.  Once labs and stool testing return, we will arrange for GI referral if needed.      Relevant Medications   pantoprazole (PROTONIX) 40 MG tablet   Other Relevant Orders   CBC with Differential (Completed)   Other Visit Diagnoses     Diarrhea, unspecified type       Relevant Orders   GI Profile, Stool, PCR       Meds ordered this encounter  Medications   pantoprazole (PROTONIX) 40 MG tablet    Sig: Take 1 tablet (40 mg total) by mouth 2 (two) times daily before a meal.    Dispense:  180 tablet    Refill:  1    Follow-up: Pending lab workup  Markies Mowatt Adriana Simas DO [facility]

## 2023-01-05 LAB — GI PROFILE, STOOL, PCR

## 2023-01-31 ENCOUNTER — Ambulatory Visit: Payer: Medicare HMO | Admitting: Family Medicine

## 2023-01-31 VITALS — BP 144/91 | HR 87 | Temp 98.2°F | Wt 175.6 lb

## 2023-01-31 DIAGNOSIS — R1013 Epigastric pain: Secondary | ICD-10-CM | POA: Diagnosis not present

## 2023-01-31 NOTE — Progress Notes (Signed)
Subjective:  Patient ID: Victor Patel, male    DOB: 04-29-1956  Age: 67 y.o. MRN: [id number]  CC: Chief Complaint  Patient presents with   Follow-up    HPI:  67 year old male presents for follow-up regarding GI symptoms.  Patient states that his symptoms of dyspepsia have improved but he still does a lot of belching.  Seems to be worse when he lies down.  He has no more epigastric pain or bloating.  He still having some changes in his stools.  They are still loose at times.  He is compliant with Protonix.  No hematochezia or melena.  No vomiting.  Patient Active Problem List   Diagnosis Date Noted   Epigastric abdominal pain 01/03/2023   Dyspepsia 09/07/2022   Thyroid nodule 06/29/2022   Annual physical exam 02/25/2022   Essential hypertension 04/23/2021   Prostate cancer (HCC) 02/05/2019   Hyperlipidemia 10/09/2012    Social Hx   Social History   Socioeconomic History   Marital status: Single    Spouse name: Not on file   Number of children: Not on file   Years of education: Not on file   Highest education level: Not on file  Occupational History   Not on file  Tobacco Use   Smoking status: Never   Smokeless tobacco: Never  Vaping Use   Vaping status: Never Used  Substance and Sexual Activity   Alcohol use: No   Drug use: No   Sexual activity: Not on file  Other Topics Concern   Not on file  Social History Narrative   Not on file   Social Determinants of Health   Financial Resource Strain: Low Risk  (12/24/2022)   Overall Financial Resource Strain (CARDIA)    Difficulty of Paying Living Expenses: Not hard at all  Food Insecurity: No Food Insecurity (12/24/2022)   Hunger Vital Sign    Worried About Running Out of Food in the Last Year: Never true    Ran Out of Food in the Last Year: Never true  Transportation Needs: No Transportation Needs (12/24/2022)   PRAPARE - Administrator, Civil Service (Medical): No    Lack of Transportation  (Non-Medical): No  Physical Activity: Sufficiently Active (12/24/2022)   Exercise Vital Sign    Days of Exercise per Week: 7 days    Minutes of Exercise per Session: 30 min  Stress: No Stress Concern Present (12/24/2022)   [employer] of Occupational Health - Occupational Stress Questionnaire    Feeling of Stress : Not at all  Social Connections: Moderately Integrated (12/24/2022)   Social Connection and Isolation Panel [NHANES]    Frequency of Communication with Friends and Family: More than three times a week    Frequency of Social Gatherings with Friends and Family: More than three times a week    Attends Religious Services: More than 4 times per year    Active Member of [employer] or Organizations: No    Attends Engineer, structural: Never    Marital Status: Married    Review of Systems Per HPI  Objective:  BP (!) 144/91   Pulse 87   Temp 98.2 F (36.8 C) (Oral)   Wt 175 lb 9.6 oz (79.7 kg)   SpO2 97%   BMI 25.93 kg/m      01/31/2023    8:38 AM 12/31/2022   10:01 AM 12/31/2022    9:29 AM  BP/Weight  Systolic BP 144 138 156  Diastolic BP 91 82  80  Wt. (Lbs) 175.6  175  BMI 25.93 kg/m2  25.84 kg/m2    Physical Exam Vitals and nursing note reviewed.  Constitutional:      General: He is not in acute distress.    Appearance: Normal appearance.  HENT:     Head: Normocephalic and atraumatic.  Cardiovascular:     Rate and Rhythm: Normal rate and regular rhythm.  Pulmonary:     Effort: Pulmonary effort is normal.     Breath sounds: Normal breath sounds. No wheezing or rales.  Abdominal:     General: There is no distension.     Palpations: Abdomen is soft.     Tenderness: There is no abdominal tenderness.  Neurological:     Mental Status: He is alert.     Lab Results  Component Value Date   WBC 5.3 12/31/2022   HGB 16.8 12/31/2022   HCT 49.0 12/31/2022   PLT 202 12/31/2022   GLUCOSE 106 (H) 12/31/2022   CHOL 139 12/31/2022   TRIG 150 (H)  12/31/2022   HDL 33 (L) 12/31/2022   LDLCALC 80 12/31/2022   ALT 35 12/31/2022   AST 42 (H) 12/31/2022   NA 144 12/31/2022   K 4.3 12/31/2022   CL 102 12/31/2022   CREATININE 0.89 12/31/2022   BUN 9 12/31/2022   CO2 28 12/31/2022   PSA 3.71 09/13/2012   HGBA1C 5.4 03/26/2021     Assessment & Plan:   Problem List Items Addressed This Visit       Other   Epigastric abdominal pain   Relevant Orders   Ambulatory referral to Gastroenterology   Dyspepsia - Primary    Improved but still symptomatic.  Continue Protonix.  Referring to GI.  I spoke with Dr. Marletta Lor.  Has a scheduled appointment.      Relevant Orders   Ambulatory referral to Gastroenterology    Follow-up:  3-6 months  Marcellas Marchant Adriana Simas DO [facility]

## 2023-01-31 NOTE — Patient Instructions (Signed)
Continue your medication.  Referral placed and I sent a message to Dr. Marletta Lor.  Follow up in 3-6 months.  Take care  Dr. Adriana Simas

## 2023-01-31 NOTE — Assessment & Plan Note (Signed)
Improved but still symptomatic.  Continue Protonix.  Referring to GI.  I spoke with Dr. Marletta Lor.  Has a scheduled appointment.

## 2023-02-02 ENCOUNTER — Encounter: Payer: Self-pay | Admitting: Internal Medicine

## 2023-02-02 ENCOUNTER — Ambulatory Visit (INDEPENDENT_AMBULATORY_CARE_PROVIDER_SITE_OTHER): Payer: Medicare HMO | Admitting: Internal Medicine

## 2023-02-02 VITALS — BP 136/82 | HR 94 | Temp 97.8°F | Ht 68.0 in | Wt 176.0 lb

## 2023-02-02 DIAGNOSIS — R140 Abdominal distension (gaseous): Secondary | ICD-10-CM

## 2023-02-02 DIAGNOSIS — R197 Diarrhea, unspecified: Secondary | ICD-10-CM

## 2023-02-02 DIAGNOSIS — R142 Eructation: Secondary | ICD-10-CM

## 2023-02-02 DIAGNOSIS — R1013 Epigastric pain: Secondary | ICD-10-CM

## 2023-02-02 NOTE — Progress Notes (Signed)
Primary Care Physician:  Tommie Sams, DO Primary Gastroenterologist:  Dr. Marletta Lor  Chief Complaint  Patient presents with   dyspepsia    Patient here today with excess belching, and depending on what he eats he has some mid sternum discomforts. He says he has also been having issues with loose non solid stools. Patient is taking protonix 40 mg bid and gas x for the symptoms    HPI:   Victor Patel is a 67 y.o. male who presents to the clinic today by referral from his PCP Dr. Adriana Simas for evaluation.  States April 2024 he had sudden onset of significant abdominal bloating, belching, abdominal pain.  Notes 2 significant episodes in this regard.  Believes it was related to diet, stopped all gluten as well as red meat.  After approximately 2 months he began to introduce gluten back in his diet without resurgence of his symptoms.  Has now reintroduced red meat as well without resurgence.  Taking pantoprazole twice daily.  No dysphagia odynophagia.  No epigastric or chest pain.  Does note chronically loose/mushy stools.  1-2 times daily in the morning.  Very rarely has a normal bowel movement.  No melena hematochezia.  No unintentional weight loss.  Colonoscopy 03/04/2017 unremarkable besides diverticulosis of the sigmoid and descending colon.  10-year recall  Past Medical History:  Diagnosis Date   Hyperlipidemia    Hypertension     Past Surgical History:  Procedure Laterality Date   CARDIAC SURGERY     COLONOSCOPY  2008   COLONOSCOPY N/A 03/04/2017   Procedure: COLONOSCOPY;  Surgeon: Corbin Ade, MD;  [facility]: [facility];  Service: Endoscopy;  Laterality: N/A;  7:30 Am   HERNIA REPAIR Right [id number]    Current Outpatient Medications  Medication Sig Dispense Refill   pantoprazole (PROTONIX) 40 MG tablet Take 1 tablet (40 mg total) by mouth 2 (two) times daily before a meal. 180 tablet 1   rosuvastatin (CRESTOR) 10 MG tablet TAKE ONE TABLET BY MOUTH ONCE DAILY. 90 tablet 0    simethicone (MYLICON) 80 MG chewable tablet Chew 80 mg by mouth every 6 (six) hours as needed for flatulence.     valsartan (DIOVAN) 80 MG tablet TAKE ONE TABLET BY MOUTH ONCE DAILY. 90 tablet 0   No current facility-administered medications for this visit.    Allergies as of 02/02/2023 - Review Complete 02/02/2023  Allergen Reaction Noted   Lipitor [atorvastatin] Other (See Comments) 01/08/2021    History reviewed. No pertinent family history.  Social History   Socioeconomic History   Marital status: Single    Spouse name: Not on file   Number of children: Not on file   Years of education: Not on file   Highest education level: Not on file  Occupational History   Not on file  Tobacco Use   Smoking status: Never   Smokeless tobacco: Never  Vaping Use   Vaping status: Never Used  Substance and Sexual Activity   Alcohol use: No   Drug use: No   Sexual activity: Not on file  Other Topics Concern   Not on file  Social History Narrative   Not on file   Social Determinants of Health   Financial Resource Strain: Low Risk  (12/24/2022)   Overall Financial Resource Strain (CARDIA)    Difficulty of Paying Living Expenses: Not hard at all  Food Insecurity: No Food Insecurity (12/24/2022)   Hunger Vital Sign    Worried About Running Out  of Food in the Last Year: Never true    Ran Out of Food in the Last Year: Never true  Transportation Needs: No Transportation Needs (12/24/2022)   PRAPARE - Administrator, Civil Service (Medical): No    Lack of Transportation (Non-Medical): No  Physical Activity: Sufficiently Active (12/24/2022)   Exercise Vital Sign    Days of Exercise per Week: 7 days    Minutes of Exercise per Session: 30 min  Stress: No Stress Concern Present (12/24/2022)   [employer] of Occupational Health - Occupational Stress Questionnaire    Feeling of Stress : Not at all  Social Connections: Moderately Integrated (12/24/2022)   Social Connection  and Isolation Panel [NHANES]    Frequency of Communication with Friends and Family: More than three times a week    Frequency of Social Gatherings with Friends and Family: More than three times a week    Attends Religious Services: More than 4 times per year    Active Member of [employer] or Organizations: No    Attends Banker Meetings: Never    Marital Status: Married  Catering manager Violence: Not At Risk (12/24/2022)   Humiliation, Afraid, Rape, and Kick questionnaire    Fear of Current or Ex-Partner: No    Emotionally Abused: No    Physically Abused: No    Sexually Abused: No    Subjective: Review of Systems  Constitutional:  Negative for chills and fever.  HENT:  Negative for congestion and hearing loss.   Eyes:  Negative for blurred vision and double vision.  Respiratory:  Negative for cough and shortness of breath.   Cardiovascular:  Negative for chest pain and palpitations.  Gastrointestinal:  Positive for diarrhea. Negative for abdominal pain, blood in stool, constipation, heartburn, melena and vomiting.       Bloating, belching, loose stools  Genitourinary:  Negative for dysuria and urgency.  Musculoskeletal:  Negative for joint pain and myalgias.  Skin:  Negative for itching and rash.  Neurological:  Negative for dizziness and headaches.  Psychiatric/Behavioral:  Negative for depression. The patient is not nervous/anxious.        Objective: BP 136/82 (BP Location: Left Arm, Patient Position: Sitting, Cuff Size: Normal)   Pulse 94   Temp 97.8 F (36.6 C) (Temporal)   Ht 5\' 8"  (1.727 m)   Wt 176 lb (79.8 kg)   BMI 26.76 kg/m  Physical Exam Constitutional:      Appearance: Normal appearance.  HENT:     Head: Normocephalic and atraumatic.  Eyes:     Extraocular Movements: Extraocular movements intact.     Conjunctiva/sclera: Conjunctivae normal.  Cardiovascular:     Rate and Rhythm: Normal rate and regular rhythm.  Pulmonary:     Effort: Pulmonary  effort is normal.     Breath sounds: Normal breath sounds.  Abdominal:     General: Bowel sounds are normal.     Palpations: Abdomen is soft.  Musculoskeletal:        General: Normal range of motion.     Cervical back: Normal range of motion and neck supple.  Skin:    General: Skin is warm.  Neurological:     General: No focal deficit present.     Mental Status: He is alert and oriented to person, place, and time.  Psychiatric:        Mood and Affect: Mood normal.        Behavior: Behavior normal.  Assessment: *Abdominal bloating *Loose stools *Dyspepsia *Belching  Plan: Etiology of patients symptoms unclear, over improved compared to prior.   Will check celiac, alpha gal, TSH, ESR, CRP  Discussed role of possible EGD to further evaluate, given his overall improvement will continue to monitor for now. May reconsider pending blood results or resurgence of symptoms.   Recommend starting daily probiotic such as align.  Colonoscopy recall 2028 for colon cancer screening  Follow-up in 3 to 4 months  Thank you Dr. Adriana Simas for the kind referral.   02/02/2023 8:49 AM   Disclaimer: This note was dictated with voice recognition software. Similar sounding words can inadvertently be transcribed and may not be corrected upon review.

## 2023-02-02 NOTE — Patient Instructions (Signed)
I am going to check blood work today at [facility] to screen you for celiac disease, alpha gal allergy, check a few inflammatory markers, as well as check your thyroid.  We will call with results.  Continue on pantoprazole twice daily.  Follow-up in 3 to 4 months.  If you have resurgence of your symptoms then let us know and we can schedule you for upper endoscopy though I think we can hold off for now.  Recommend starting a probiotic daily such as align to see if this helps with your bowels.  It was very nice meeting you today.  Dr. Marletta Lor

## 2023-02-03 LAB — CELIAC DISEASE PANEL
(tTG) Ab, IgA: <1 U/mL
(tTG) Ab, IgG: <1 U/mL
Gliadin IgA: 1.7 U/mL
Gliadin IgG: <1 U/mL
Immunoglobulin A: 206 mg/dL (ref 70–320)

## 2023-02-03 LAB — TSH+FREE T4: TSH W/REFLEX TO FT4: 1.84 mIU/L (ref 0.40–4.50)

## 2023-02-03 LAB — ALPHA-GAL PANEL
Allergen, Mutton, f88: 0.74 kU/L — ABNORMAL HIGH
Allergen, Pork, f26: 0.69 kU/L — ABNORMAL HIGH
Beef: 2.62 kU/L — ABNORMAL HIGH
CLASS: 1
CLASS: 2
Class: 2

## 2023-02-03 LAB — SEDIMENTATION RATE: Sed Rate: 51 mm/h — ABNORMAL HIGH (ref 0–20)

## 2023-02-03 LAB — C-REACTIVE PROTEIN: CRP: 5.5 mg/L (ref <8.0)

## 2023-02-03 LAB — INTERPRETATION:

## 2023-02-08 ENCOUNTER — Encounter: Payer: Self-pay | Admitting: Internal Medicine

## 2023-02-10 ENCOUNTER — Other Ambulatory Visit: Payer: Self-pay | Admitting: Family Medicine

## 2023-02-10 DIAGNOSIS — Z8546 Personal history of malignant neoplasm of prostate: Secondary | ICD-10-CM | POA: Diagnosis not present

## 2023-02-10 DIAGNOSIS — Z23 Encounter for immunization: Secondary | ICD-10-CM | POA: Diagnosis not present

## 2023-02-10 DIAGNOSIS — Z9079 Acquired absence of other genital organ(s): Secondary | ICD-10-CM | POA: Diagnosis not present

## 2023-02-10 DIAGNOSIS — C61 Malignant neoplasm of prostate: Secondary | ICD-10-CM | POA: Diagnosis not present

## 2023-02-10 DIAGNOSIS — R9721 Rising PSA following treatment for malignant neoplasm of prostate: Secondary | ICD-10-CM | POA: Diagnosis not present

## 2023-02-10 DIAGNOSIS — N5231 Erectile dysfunction following radical prostatectomy: Secondary | ICD-10-CM | POA: Diagnosis not present

## 2023-02-10 DIAGNOSIS — Z2821 Immunization not carried out because of patient refusal: Secondary | ICD-10-CM | POA: Diagnosis not present

## 2023-02-10 LAB — ALPHA-GAL PANEL: GALACTOSE-ALPHA-1,3-GALACTOSE IGE*: 8.21 kU/L — ABNORMAL HIGH (ref <0.10)

## 2023-02-14 DIAGNOSIS — C61 Malignant neoplasm of prostate: Secondary | ICD-10-CM | POA: Diagnosis not present

## 2023-02-22 DIAGNOSIS — C61 Malignant neoplasm of prostate: Secondary | ICD-10-CM | POA: Diagnosis not present

## 2023-02-22 DIAGNOSIS — Z5111 Encounter for antineoplastic chemotherapy: Secondary | ICD-10-CM | POA: Diagnosis not present

## 2023-03-14 ENCOUNTER — Other Ambulatory Visit: Payer: Self-pay | Admitting: Family Medicine

## 2023-03-14 DIAGNOSIS — I10 Essential (primary) hypertension: Secondary | ICD-10-CM

## 2023-03-15 DIAGNOSIS — C61 Malignant neoplasm of prostate: Secondary | ICD-10-CM | POA: Diagnosis not present

## 2023-03-23 DIAGNOSIS — C61 Malignant neoplasm of prostate: Secondary | ICD-10-CM | POA: Diagnosis not present

## 2023-03-29 DIAGNOSIS — C61 Malignant neoplasm of prostate: Secondary | ICD-10-CM | POA: Diagnosis not present

## 2023-03-30 DIAGNOSIS — C61 Malignant neoplasm of prostate: Secondary | ICD-10-CM | POA: Diagnosis not present

## 2023-03-31 DIAGNOSIS — C61 Malignant neoplasm of prostate: Secondary | ICD-10-CM | POA: Diagnosis not present

## 2023-04-01 DIAGNOSIS — C61 Malignant neoplasm of prostate: Secondary | ICD-10-CM | POA: Diagnosis not present

## 2023-04-04 DIAGNOSIS — C61 Malignant neoplasm of prostate: Secondary | ICD-10-CM | POA: Diagnosis not present

## 2023-04-05 DIAGNOSIS — C61 Malignant neoplasm of prostate: Secondary | ICD-10-CM | POA: Diagnosis not present

## 2023-04-06 DIAGNOSIS — C61 Malignant neoplasm of prostate: Secondary | ICD-10-CM | POA: Diagnosis not present

## 2023-04-11 DIAGNOSIS — C61 Malignant neoplasm of prostate: Secondary | ICD-10-CM | POA: Diagnosis not present

## 2023-04-12 DIAGNOSIS — C61 Malignant neoplasm of prostate: Secondary | ICD-10-CM | POA: Diagnosis not present

## 2023-04-13 DIAGNOSIS — C61 Malignant neoplasm of prostate: Secondary | ICD-10-CM | POA: Diagnosis not present

## 2023-04-14 DIAGNOSIS — C61 Malignant neoplasm of prostate: Secondary | ICD-10-CM | POA: Diagnosis not present

## 2023-04-15 DIAGNOSIS — C61 Malignant neoplasm of prostate: Secondary | ICD-10-CM | POA: Diagnosis not present

## 2023-04-18 DIAGNOSIS — C61 Malignant neoplasm of prostate: Secondary | ICD-10-CM | POA: Diagnosis not present

## 2023-04-19 DIAGNOSIS — C61 Malignant neoplasm of prostate: Secondary | ICD-10-CM | POA: Diagnosis not present

## 2023-04-20 DIAGNOSIS — C61 Malignant neoplasm of prostate: Secondary | ICD-10-CM | POA: Diagnosis not present

## 2023-04-21 DIAGNOSIS — C61 Malignant neoplasm of prostate: Secondary | ICD-10-CM | POA: Diagnosis not present

## 2023-04-22 DIAGNOSIS — C61 Malignant neoplasm of prostate: Secondary | ICD-10-CM | POA: Diagnosis not present

## 2023-04-25 DIAGNOSIS — C61 Malignant neoplasm of prostate: Secondary | ICD-10-CM | POA: Diagnosis not present

## 2023-04-26 DIAGNOSIS — C61 Malignant neoplasm of prostate: Secondary | ICD-10-CM | POA: Diagnosis not present

## 2023-04-27 DIAGNOSIS — C61 Malignant neoplasm of prostate: Secondary | ICD-10-CM | POA: Diagnosis not present

## 2023-04-28 DIAGNOSIS — C61 Malignant neoplasm of prostate: Secondary | ICD-10-CM | POA: Diagnosis not present

## 2023-04-29 DIAGNOSIS — C61 Malignant neoplasm of prostate: Secondary | ICD-10-CM | POA: Diagnosis not present

## 2023-05-02 DIAGNOSIS — C61 Malignant neoplasm of prostate: Secondary | ICD-10-CM | POA: Diagnosis not present

## 2023-05-03 DIAGNOSIS — C61 Malignant neoplasm of prostate: Secondary | ICD-10-CM | POA: Diagnosis not present

## 2023-05-05 DIAGNOSIS — C61 Malignant neoplasm of prostate: Secondary | ICD-10-CM | POA: Diagnosis not present

## 2023-05-06 DIAGNOSIS — C61 Malignant neoplasm of prostate: Secondary | ICD-10-CM | POA: Diagnosis not present

## 2023-05-09 DIAGNOSIS — C61 Malignant neoplasm of prostate: Secondary | ICD-10-CM | POA: Diagnosis not present

## 2023-05-10 DIAGNOSIS — C61 Malignant neoplasm of prostate: Secondary | ICD-10-CM | POA: Diagnosis not present

## 2023-05-12 DIAGNOSIS — C61 Malignant neoplasm of prostate: Secondary | ICD-10-CM | POA: Diagnosis not present

## 2023-05-13 DIAGNOSIS — C61 Malignant neoplasm of prostate: Secondary | ICD-10-CM | POA: Diagnosis not present

## 2023-05-16 DIAGNOSIS — C61 Malignant neoplasm of prostate: Secondary | ICD-10-CM | POA: Diagnosis not present

## 2023-05-17 DIAGNOSIS — C61 Malignant neoplasm of prostate: Secondary | ICD-10-CM | POA: Diagnosis not present

## 2023-05-18 DIAGNOSIS — C61 Malignant neoplasm of prostate: Secondary | ICD-10-CM | POA: Diagnosis not present

## 2023-05-23 DIAGNOSIS — Z0100 Encounter for examination of eyes and vision without abnormal findings: Secondary | ICD-10-CM | POA: Diagnosis not present

## 2023-05-23 DIAGNOSIS — H524 Presbyopia: Secondary | ICD-10-CM | POA: Diagnosis not present

## 2023-06-03 NOTE — Progress Notes (Deleted)
 GI Office Note    Referring Provider: Tommie Sams, DO Primary Care Physician:  Tommie Sams, DO Primary Gastroenterologist: Hennie Duos. Marletta Lor, DO  Date:  06/03/2023  ID:  Victor Patel, DOB Feb 04, 1956, MRN [id number]   Chief Complaint   No chief complaint on file.   History of Present Illness  Victor Patel is a 68 y.o. male with a history of HLD, HTN, and recurrent prostate cancer s/p recent radiation presenting today for follow-up of dyspepsia symptoms.  Colonoscopy 03/04/2017: -***  Last office visit 02/02/23 with Dr. Marletta Lor***. Recommended assessing for celiac, alpha gal, TSH, ESR, and CRP.  Discussed possible EGD to further evaluate his symptoms pending blood work or resurgence of symptoms.  Recommended starting a daily probiotic.  Advised to follow-up in 3 to 4 months  Per review of patient's chart it appears as though he is following with [facility] radiation oncology for treatments for prostate cancer.  Appears that he completed radiation 05/18/2023.  Labs 02/02/2023: Celiac panel negative, TSH within normal limits.  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  Patient offered allergy referral.  Today:    Wt Readings from Last 3 Encounters:  02/02/23 176 lb (79.8 kg)  01/31/23 175 lb 9.6 oz (79.7 kg)  12/31/22 175 lb (79.4 kg)    Current Outpatient Medications  Medication Sig Dispense Refill   pantoprazole (PROTONIX) 40 MG tablet Take 1 tablet (40 mg total) by mouth 2 (two) times daily before a meal. 180 tablet 1   rosuvastatin (CRESTOR) 10 MG tablet TAKE ONE TABLET BY MOUTH ONCE DAILY. 90 tablet 0   simethicone (MYLICON) 80 MG chewable tablet Chew 80 mg by mouth every 6 (six) hours as needed for flatulence.     valsartan (DIOVAN) 80 MG tablet TAKE ONE TABLET BY MOUTH ONCE DAILY. 90 tablet 0   No current facility-administered medications for this visit.    Past Medical History:  Diagnosis Date   Hyperlipidemia    Hypertension     Past Surgical  History:  Procedure Laterality Date   CARDIAC SURGERY     COLONOSCOPY  2008   COLONOSCOPY N/A 03/04/2017   Procedure: COLONOSCOPY;  Surgeon: Corbin Ade, MD;  Location: [facility];  Service: Endoscopy;  Laterality: N/A;  7:30 Am   HERNIA REPAIR Right [id number]    Family History  Problem Relation Age of Onset   Breast cancer Mother    Lung cancer Father     Allergies as of 06/06/2023 - Review Complete 02/02/2023  Allergen Reaction Noted   Lipitor [atorvastatin] Other (See Comments) 01/08/2021    Social History   Socioeconomic History   Marital status: Single    Spouse name: Not on file   Number of children: Not on file   Years of education: Not on file   Highest education level: Not on file  Occupational History   Not on file  Tobacco Use   Smoking status: Never   Smokeless tobacco: Never  Vaping Use   Vaping status: Never Used  Substance and Sexual Activity   Alcohol use: No   Drug use: No   Sexual activity: Not on file  Other Topics Concern   Not on file  Social History Narrative   Not on file   Social Drivers of Health   Financial Resource Strain: Low Risk  (12/24/2022)   Overall Financial Resource Strain (CARDIA)    Difficulty of Paying Living Expenses: Not hard at all  Food Insecurity: No Food  Insecurity (12/24/2022)   Hunger Vital Sign    Worried About Running Out of Food in the Last Year: Never true    Ran Out of Food in the Last Year: Never true  Transportation Needs: No Transportation Needs (12/24/2022)   PRAPARE - Administrator, Civil Service (Medical): No    Lack of Transportation (Non-Medical): No  Physical Activity: Sufficiently Active (12/24/2022)   Exercise Vital Sign    Days of Exercise per Week: 7 days    Minutes of Exercise per Session: 30 min  Stress: No Stress Concern Present (12/24/2022)   [employer] of Occupational Health - Occupational Stress Questionnaire    Feeling of Stress : Not at all  Social Connections:  Moderately Integrated (12/24/2022)   Social Connection and Isolation Panel [NHANES]    Frequency of Communication with Friends and Family: More than three times a week    Frequency of Social Gatherings with Friends and Family: More than three times a week    Attends Religious Services: More than 4 times per year    Active Member of [employer] or Organizations: No    Attends Banker Meetings: Never    Marital Status: Married     Review of Systems   Gen: Denies fever, chills, anorexia. Denies fatigue, weakness, weight loss.  CV: Denies chest pain, palpitations, syncope, peripheral edema, and claudication. Resp: Denies dyspnea at rest, cough, wheezing, coughing up blood, and pleurisy. GI: See HPI Derm: Denies rash, itching, dry skin Psych: Denies depression, anxiety, memory loss, confusion. No homicidal or suicidal ideation.  Heme: Denies bruising, bleeding, and enlarged lymph nodes.  Physical Exam   There were no vitals taken for this visit.  General:   Alert and oriented. No distress noted. Pleasant and cooperative.  Head:  Normocephalic and atraumatic. Eyes:  Conjuctiva clear without scleral icterus. Mouth:  Oral mucosa pink and moist. Good dentition. No lesions. Lungs:  Clear to auscultation bilaterally. No wheezes, rales, or rhonchi. No distress.  Heart:  S1, S2 present without murmurs appreciated.  Abdomen:  +BS, soft, non-tender and non-distended. No rebound or guarding. No HSM or masses noted. Rectal: *** Msk:  Symmetrical without gross deformities. Normal posture. Extremities:  Without edema. Neurologic:  Alert and  oriented x4 Psych:  Alert and cooperative. Normal mood and affect.  Assessment  Victor Patel is a 68 y.o. 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  PLAN   ***     Brooke Bonito, MSN, FNP-BC,  AGACNP-BC [facility]

## 2023-06-06 ENCOUNTER — Ambulatory Visit: Payer: Medicare HMO | Admitting: Gastroenterology

## 2023-08-01 ENCOUNTER — Ambulatory Visit: Payer: Medicare HMO | Admitting: Family Medicine

## 2023-08-01 VITALS — BP 147/91 | HR 86 | Temp 98.1°F | Ht 68.0 in | Wt 174.0 lb

## 2023-08-01 DIAGNOSIS — E782 Mixed hyperlipidemia: Secondary | ICD-10-CM

## 2023-08-01 DIAGNOSIS — I10 Essential (primary) hypertension: Secondary | ICD-10-CM

## 2023-08-01 DIAGNOSIS — C61 Malignant neoplasm of prostate: Secondary | ICD-10-CM

## 2023-08-01 DIAGNOSIS — Z91018 Allergy to other foods: Secondary | ICD-10-CM | POA: Diagnosis not present

## 2023-08-01 MED ORDER — VALSARTAN 80 MG PO TABS
80.0000 mg | ORAL_TABLET | Freq: Every day | ORAL | 3 refills | Status: DC
Start: 2023-08-01 — End: 2024-02-01

## 2023-08-01 NOTE — Assessment & Plan Note (Signed)
 Patient has stopped statin therapy.  Will continue to monitor.

## 2023-08-01 NOTE — Patient Instructions (Signed)
 Continue your medication.  Watch BP.  Follow up in 6 months.

## 2023-08-01 NOTE — Progress Notes (Addendum)
 Subjective:  Patient ID: Victor Patel, male    DOB: July 24, 1955  Age: 67 y.o. MRN: [id number]  CC:  Follow up    HPI:  68 year old male with Prostate cancer, Hypertension, and alpha gal presents for follow up.  Patient has received hormone treatment as well as radiation regarding prostate cancer.  He follows closely with radiation oncology, oncology, and urology.  Hypertension stable on valsartan.  He states that his blood pressure readings are well-controlled at home.  He is compliant with valsartan.  He is overall feeling well.  He has changed his diet after diagnosis of alpha gal.  Doing well.  Patient Active Problem List   Diagnosis Date Noted   Allergy to alpha-gal 08/01/2023   Thyroid nodule 06/29/2022   Essential hypertension 04/23/2021   Prostate cancer (HCC) 02/05/2019   Hyperlipidemia 10/09/2012    Social Hx   Social History   Socioeconomic History   Marital status: Single    Spouse name: Not on file   Number of children: Not on file   Years of education: Not on file   Highest education level: Not on file  Occupational History   Not on file  Tobacco Use   Smoking status: Never   Smokeless tobacco: Never  Vaping Use   Vaping status: Never Used  Substance and Sexual Activity   Alcohol use: No   Drug use: No   Sexual activity: Not on file  Other Topics Concern   Not on file  Social History Narrative   Not on file   Social Drivers of Health   Financial Resource Strain: Low Risk  (12/24/2022)   Overall Financial Resource Strain (CARDIA)    Difficulty of Paying Living Expenses: Not hard at all  Food Insecurity: No Food Insecurity (12/24/2022)   Hunger Vital Sign    Worried About Running Out of Food in the Last Year: Never true    Ran Out of Food in the Last Year: Never true  Transportation Needs: No Transportation Needs (12/24/2022)   PRAPARE - Administrator, Civil Service (Medical): No    Lack of Transportation (Non-Medical): No   Physical Activity: Sufficiently Active (12/24/2022)   Exercise Vital Sign    Days of Exercise per Week: 7 days    Minutes of Exercise per Session: 30 min  Stress: No Stress Concern Present (12/24/2022)   [employer] of Occupational Health - Occupational Stress Questionnaire    Feeling of Stress : Not at all  Social Connections: Moderately Integrated (12/24/2022)   Social Connection and Isolation Panel [NHANES]    Frequency of Communication with Friends and Family: More than three times a week    Frequency of Social Gatherings with Friends and Family: More than three times a week    Attends Religious Services: More than 4 times per year    Active Member of [employer] or Organizations: No    Attends Engineer, structural: Never    Marital Status: Married    Review of Systems Per HPI  Objective:  BP (!) 147/91   Pulse 86   Temp 98.1 F (36.7 C)   Ht 5\' 8"  (1.727 m)   Wt 174 lb (78.9 kg)   SpO2 97%   BMI 26.46 kg/m      08/01/2023    8:27 AM 08/01/2023    8:19 AM 02/02/2023    8:44 AM  BP/Weight  Systolic BP 147 160 136  Diastolic BP 91 111 82  Wt. (Lbs)  174   BMI  26.46 kg/m2     Physical Exam Vitals and nursing note reviewed.  Constitutional:      General: He is not in acute distress.    Appearance: Normal appearance.  HENT:     Head: Normocephalic and atraumatic.  Eyes:     General:        Right eye: No discharge.        Left eye: No discharge.     Conjunctiva/sclera: Conjunctivae normal.  Cardiovascular:     Rate and Rhythm: Normal rate and regular rhythm.  Pulmonary:     Effort: Pulmonary effort is normal.     Breath sounds: Normal breath sounds.  Neurological:     Mental Status: He is alert.  Psychiatric:        Mood and Affect: Mood normal.        Behavior: Behavior normal.     Lab Results  Component Value Date   WBC 5.3 12/31/2022   HGB 16.8 12/31/2022   HCT 49.0 12/31/2022   PLT 202 12/31/2022   GLUCOSE 106 (H) 12/31/2022   CHOL  139 12/31/2022   TRIG 150 (H) 12/31/2022   HDL 33 (L) 12/31/2022   LDLCALC 80 12/31/2022   ALT 35 12/31/2022   AST 42 (H) 12/31/2022   NA 144 12/31/2022   K 4.3 12/31/2022   CL 102 12/31/2022   CREATININE 0.89 12/31/2022   BUN 9 12/31/2022   CO2 28 12/31/2022   PSA 3.71 09/13/2012   HGBA1C 5.4 03/26/2021     Assessment & Plan:  Essential hypertension Assessment & Plan: BP elevated here today but home readings well-controlled.  Continue valsartan.  Refilled today.  Orders: -     Valsartan; Take 1 tablet (80 mg total) by mouth daily.  Dispense: 90 tablet; Refill: 3  Allergy to alpha-gal Assessment & Plan: Stable.  Doing well at this time with dietary changes.   Prostate cancer [facility]) Assessment & Plan: Following closely with radiation oncology, oncology, and urology.   Mixed hyperlipidemia Assessment & Plan: Patient has stopped statin therapy.  Will continue to monitor.    Follow-up: 6 months  Eular Panek Adriana Simas DO [facility]

## 2023-08-01 NOTE — Assessment & Plan Note (Signed)
 Following closely with radiation oncology, oncology, and urology.

## 2023-08-01 NOTE — Assessment & Plan Note (Signed)
 BP elevated here today but home readings well-controlled.  Continue valsartan.  Refilled today.

## 2023-08-01 NOTE — Assessment & Plan Note (Addendum)
 Stable.  Doing well at this time with dietary changes.

## 2023-08-01 NOTE — Addendum Note (Signed)
 Addended by: Tommie Sams on: 08/01/2023 09:33 AM   Modules accepted: Level of Service

## 2023-08-23 DIAGNOSIS — C61 Malignant neoplasm of prostate: Secondary | ICD-10-CM | POA: Diagnosis not present

## 2023-08-23 DIAGNOSIS — Z79818 Long term (current) use of other agents affecting estrogen receptors and estrogen levels: Secondary | ICD-10-CM | POA: Diagnosis not present

## 2023-08-23 DIAGNOSIS — Z9079 Acquired absence of other genital organ(s): Secondary | ICD-10-CM | POA: Diagnosis not present

## 2023-08-23 DIAGNOSIS — R9721 Rising PSA following treatment for malignant neoplasm of prostate: Secondary | ICD-10-CM | POA: Diagnosis not present

## 2023-09-19 DIAGNOSIS — Z08 Encounter for follow-up examination after completed treatment for malignant neoplasm: Secondary | ICD-10-CM | POA: Diagnosis not present

## 2023-09-19 DIAGNOSIS — X32XXXD Exposure to sunlight, subsequent encounter: Secondary | ICD-10-CM | POA: Diagnosis not present

## 2023-09-19 DIAGNOSIS — L570 Actinic keratosis: Secondary | ICD-10-CM | POA: Diagnosis not present

## 2023-09-19 DIAGNOSIS — D225 Melanocytic nevi of trunk: Secondary | ICD-10-CM | POA: Diagnosis not present

## 2023-09-19 DIAGNOSIS — Z1283 Encounter for screening for malignant neoplasm of skin: Secondary | ICD-10-CM | POA: Diagnosis not present

## 2023-09-19 DIAGNOSIS — Z85820 Personal history of malignant melanoma of skin: Secondary | ICD-10-CM | POA: Diagnosis not present

## 2023-10-24 DIAGNOSIS — L2989 Other pruritus: Secondary | ICD-10-CM | POA: Diagnosis not present

## 2023-10-24 DIAGNOSIS — B078 Other viral warts: Secondary | ICD-10-CM | POA: Diagnosis not present

## 2023-11-15 DIAGNOSIS — C61 Malignant neoplasm of prostate: Secondary | ICD-10-CM | POA: Diagnosis not present

## 2023-12-30 ENCOUNTER — Ambulatory Visit (INDEPENDENT_AMBULATORY_CARE_PROVIDER_SITE_OTHER): Payer: Medicare HMO

## 2023-12-30 VITALS — Ht 68.0 in | Wt 174.0 lb

## 2023-12-30 DIAGNOSIS — Z0000 Encounter for general adult medical examination without abnormal findings: Secondary | ICD-10-CM

## 2023-12-30 NOTE — Progress Notes (Signed)
 Subjective:   Victor Patel is a 68 y.o. who presents for a Medicare Wellness preventive visit.  As a reminder, Annual Wellness Visits don't include a physical exam, and some assessments may be limited, especially if this visit is performed virtually. We may recommend an in-person follow-up visit with your provider if needed.  Visit Complete: Virtual I connected with  Victor Patel on 12/30/23 by a audio enabled telemedicine application and verified that I am speaking with the correct person using two identifiers.  Patient Location: Home  Provider Location: Home Office  I discussed the limitations of evaluation and management by telemedicine. The patient expressed understanding and agreed to proceed.  Vital Signs: Because this visit was a virtual/telehealth visit, some criteria may be missing or patient reported. Any vitals not documented were not able to be obtained and vitals that have been documented are patient reported.  VideoDeclined- This patient declined Librarian, academic. Therefore the visit was completed with audio only.  Persons Participating in Visit: Patient.  AWV Questionnaire: No: Patient Medicare AWV questionnaire was not completed prior to this visit.  Cardiac Risk Factors include: advanced age (>72men, >3 women);dyslipidemia;hypertension;male gender     Objective:    Today's Vitals   12/30/23 0803  Weight: 174 lb (78.9 kg)  Height: 5' 8 (1.727 m)   Body mass index is 26.46 kg/m.     12/30/2023    8:33 AM 12/24/2022    8:06 AM 03/04/2017    6:46 AM  Advanced Directives  Does Patient Have a Medical Advance Directive? No No No   Would patient like information on creating a medical advance directive? Yes (MAU/Ambulatory/Procedural Areas - Information given) No - Patient declined No - Patient declined      Data saved with a previous flowsheet row definition    Current Medications (verified) Outpatient Encounter Medications  as of 12/30/2023  Medication Sig   valsartan  (DIOVAN ) 80 MG tablet Take 1 tablet (80 mg total) by mouth daily.   No facility-administered encounter medications on file as of 12/30/2023.    Allergies (verified) Lipitor [atorvastatin ]   History: Past Medical History:  Diagnosis Date   Hyperlipidemia    Hypertension    Past Surgical History:  Procedure Laterality Date   CARDIAC SURGERY     COLONOSCOPY  2008   COLONOSCOPY N/A 03/04/2017   Procedure: COLONOSCOPY;  Surgeon: Shaaron Lamar HERO, MD;  [facility];  Service: Endoscopy;  Laterality: N/A;  7:30 Am   HERNIA REPAIR Right [id number]   Family History  Problem Relation Age of Onset   Breast cancer Mother    Lung cancer Father    Social History   Socioeconomic History   Marital status: Single    Spouse name: Not on file   Number of children: Not on file   Years of education: Not on file   Highest education level: Not on file  Occupational History   Not on file  Tobacco Use   Smoking status: Never   Smokeless tobacco: Never  Vaping Use   Vaping status: Never Used  Substance and Sexual Activity   Alcohol use: No   Drug use: No   Sexual activity: Not on file  Other Topics Concern   Not on file  Social History Narrative   Not on file   Social Drivers of Health   Financial Resource Strain: Low Risk  (12/30/2023)   Overall Financial Resource Strain (CARDIA)    Difficulty of Paying Living Expenses: Not  hard at all  Food Insecurity: No Food Insecurity (12/30/2023)   Hunger Vital Sign    Worried About Running Out of Food in the Last Year: Never true    Ran Out of Food in the Last Year: Never true  Transportation Needs: No Transportation Needs (12/30/2023)   PRAPARE - Administrator, Civil Service (Medical): No    Lack of Transportation (Non-Medical): No  Physical Activity: Sufficiently Active (12/30/2023)   Exercise Vital Sign    Days of Exercise per Week: 7 days    Minutes of Exercise per Session:  30 min  Stress: No Stress Concern Present (12/30/2023)   [employer] of Occupational Health - Occupational Stress Questionnaire    Feeling of Stress: Not at all  Social Connections: Moderately Integrated (12/30/2023)   Social Connection and Isolation Panel    Frequency of Communication with Friends and Family: More than three times a week    Frequency of Social Gatherings with Friends and Family: Three times a week    Attends Religious Services: More than 4 times per year    Active Member of Clubs or Organizations: No    Attends Banker Meetings: Never    Marital Status: Married    Tobacco Counseling Counseling given: Not Answered    Clinical Intake:  Pre-visit preparation completed: Yes  Pain : No/denies pain  Diabetes: No  Lab Results  Component Value Date   HGBA1C 5.4 03/26/2021     How often do you need to have someone help you when you read instructions, pamphlets, or other written materials from your doctor or pharmacy?: 1 - Never  Interpreter Needed?: No  Information entered by :: Victor Bloodgood LPN   Activities of Daily Living     12/30/2023    8:04 AM  In your present state of health, do you have any difficulty performing the following activities:  Hearing? 0  Vision? 0  Difficulty concentrating or making decisions? 0  Walking or climbing stairs? 0  Dressing or bathing? 0  Doing errands, shopping? 0  Preparing Food and eating ? N  Using the Toilet? N  In the past six months, have you accidently leaked urine? N  Do you have problems with loss of bowel control? N  Managing your Medications? N  Managing your Finances? N  Housekeeping or managing your Housekeeping? N    Patient Care Team: Cook, Victor G, DO as PCP - General (Family Medicine) Victor Prentice PARAS, MD as Referring Physician (Internal Medicine) Victor Rush, MD (Dermatology)  I have updated your Care Teams any recent Medical Services you may have received from other  providers in the past year.     Assessment:   This is a routine wellness examination for Victor Patel.  Hearing/Vision screen Hearing Screening - Comments:: Denies hearing difficulties   Vision Screening - Comments:: up to date with routine eye exams with Dr Victor Patel    Goals Addressed             This Visit's Progress    Remain active and independent   On track      Depression Screen     12/30/2023    8:30 AM 08/01/2023    8:27 AM 01/31/2023    8:41 AM 12/31/2022    9:29 AM 12/24/2022    8:07 AM 06/29/2022    3:08 PM 02/24/2022    9:03 AM  PHQ 2/9 Scores  PHQ - 2 Score 0 0 0 0 0 0 0  PHQ- 9 Score  3 0  0 0     Fall Risk     12/30/2023    8:33 AM 01/31/2023    8:41 AM 12/31/2022    9:28 AM 12/24/2022    8:06 AM 02/24/2022    9:03 AM  Fall Risk   Falls in the past year? 0 0 0 0 0  Number falls in past yr: 0 0 0 0 0  Injury with Fall? 0 0 0 0 0  Risk for fall due to : No Fall Risks   No Fall Risks No Fall Risks  Follow up Falls prevention discussed;Education provided;Falls evaluation completed   Falls prevention discussed Falls evaluation completed      Data saved with a previous flowsheet row definition    MEDICARE RISK AT HOME:  Medicare Risk at Home Any stairs in or around the home?: No If so, are there any without handrails?: No Home free of loose throw rugs in walkways, pet beds, electrical cords, etc?: Yes Adequate lighting in your home to reduce risk of falls?: Yes Life alert?: No Use of a cane, walker or w/c?: No Grab bars in the bathroom?: Yes Shower chair or bench in shower?: No Elevated toilet seat or a handicapped toilet?: Yes  TIMED UP AND GO:  Was the test performed?  No  Cognitive Function: Declined/Normal: No cognitive concerns noted by patient or family. Patient alert, oriented, able to answer questions appropriately and recall recent events. No signs of memory loss or confusion.        12/24/2022    8:07 AM  6CIT Screen  What Year? 0 points   What month? 0 points  What time? 0 points  Count back from 20 0 points  Months in reverse 0 points  Repeat phrase 0 points  Total Score 0 points    Immunizations Immunization History  Administered Date(s) Administered   Influenza,inj,Quad PF,6+ Mos 04/23/2021   Influenza-Unspecified 02/08/2013, 02/07/2014, 02/12/2015   Moderna Sars-Covid-2 Vaccination 07/27/2019, 08/29/2019, 05/16/2020   Zoster Recombinant(Shingrix ) 02/08/2019, 06/04/2019    Screening Tests Health Maintenance  Topic Date Due   DTaP/Tdap/Td (1 - Tdap) Never done   Pneumococcal Vaccine: 50+ Years (1 of 1 - PCV) Never done   INFLUENZA VACCINE  08/07/2024 (Originally 12/09/2023)   Medicare Annual Wellness (AWV)  12/29/2024   Colonoscopy  03/05/2027   Zoster Vaccines- Shingrix   Completed   HPV VACCINES  Aged Out   Meningococcal B Vaccine  Aged Out   COVID-19 Vaccine  Discontinued   Hepatitis C Screening  Discontinued    Health Maintenance  Health Maintenance Due  Topic Date Due   DTaP/Tdap/Td (1 - Tdap) Never done   Pneumococcal Vaccine: 50+ Years (1 of 1 - PCV) Never done   Health Maintenance Items Addressed: Information provided on vaccine recommendations   Additional Screening:  Vision Screening: Recommended annual ophthalmology exams for early detection of glaucoma and other disorders of the eye. Would you like a referral to an eye doctor? No    Dental Screening: Recommended annual dental exams for proper oral hygiene  Community Resource Referral / Chronic Care Management: CRR required this visit?  No   CCM required this visit?  No   Plan:    I have personally reviewed and noted the following in the patient's chart:   Medical and social history Use of alcohol, tobacco or illicit drugs  Current medications and supplements including opioid prescriptions. Patient is not currently taking opioid prescriptions. Functional ability and  status Nutritional status Physical activity Advanced  directives List of other physicians Hospitalizations, surgeries, and ER visits in previous 12 months Vitals Screenings to include cognitive, depression, and falls Referrals and appointments  In addition, I have reviewed and discussed with patient certain preventive protocols, quality metrics, and best practice recommendations. A written personalized care plan for preventive services as well as general preventive health recommendations were provided to patient.   Lavelle Pfeiffer [place], [place]   1/77/7974   After Visit Summary: (MyChart) Due to this being a telephonic visit, the after visit summary with patients personalized plan was offered to patient via MyChart   Notes: Nothing significant to report at this time.

## 2023-12-30 NOTE — Patient Instructions (Addendum)
 Mr. Routh , Thank you for taking time out of your busy schedule to complete your Annual Wellness Visit with me. I enjoyed our conversation and look forward to speaking with you again next year. I, as well as your care team,  appreciate your ongoing commitment to your health goals. Please review the following plan we discussed and let me know if I can assist you in the future. Your Game plan/ To Do List     Follow up Visits: We will see or speak with you next year for your Next Medicare AWV with our clinical staff Have you seen your provider in the last 6 months (3 months if uncontrolled diabetes)? Yes  Clinician Recommendations:  Aim for 30 minutes of exercise or brisk walking, 6-8 glasses of water, and 5 servings of fruits and vegetables each day.       This is a list of the screenings recommended for you:  Health Maintenance  Topic Date Due   DTaP/Tdap/Td vaccine (1 - Tdap) Never done   Pneumococcal Vaccine for age over 83 (1 of 1 - PCV) Never done   Flu Shot  08/07/2024*   Medicare Annual Wellness Visit  12/29/2024   Colon Cancer Screening  03/05/2027   Zoster (Shingles) Vaccine  Completed   HPV Vaccine  Aged Out   Meningitis B Vaccine  Aged Out   COVID-19 Vaccine  Discontinued   Hepatitis C Screening  Discontinued  *Topic was postponed. The date shown is not the original due date.    Advanced directives: (ACP Link)Information on Advanced Care Planning can be found at [place]  Secretary of [facility] Advance Health Care Directives Advance Health Care Directives. [email or web address]   Advance Care Planning is important because it:  [x]  Makes sure you receive the medical care that is consistent with your values, goals, and preferences  [x]  It provides guidance to your family and loved ones and reduces their decisional burden about whether or not they are making the right decisions based on your wishes.  Follow the link provided in your after visit summary or read over the paperwork we  have mailed to you to help you started getting your Advance Directives in place. If you need assistance in completing these, please reach out to us  so that we can help you!  See attachments for Preventive Care and Fall Prevention Tips.

## 2024-02-01 ENCOUNTER — Ambulatory Visit: Admitting: Family Medicine

## 2024-02-01 VITALS — BP 129/83 | HR 86 | Ht 68.0 in | Wt 178.0 lb

## 2024-02-01 DIAGNOSIS — C61 Malignant neoplasm of prostate: Secondary | ICD-10-CM

## 2024-02-01 DIAGNOSIS — Z91018 Allergy to other foods: Secondary | ICD-10-CM | POA: Diagnosis not present

## 2024-02-01 DIAGNOSIS — I10 Essential (primary) hypertension: Secondary | ICD-10-CM | POA: Diagnosis not present

## 2024-02-01 MED ORDER — VALSARTAN 80 MG PO TABS: 80.0000 mg | ORAL_TABLET | Freq: Every day | ORAL | 3 refills | Status: AC

## 2024-02-01 NOTE — Assessment & Plan Note (Signed)
 Patient reports that he has slowly reintroduced meat and is doing well at this time.

## 2024-02-01 NOTE — Assessment & Plan Note (Signed)
 Stable at this time.  Following closely with [facility].

## 2024-02-01 NOTE — Progress Notes (Signed)
 Subjective:  Patient ID: Victor Patel, male    DOB: 01/27/1956  Age: 68 y.o. MRN: [id number]  CC:   Chief Complaint  Patient presents with   Medical Management of Chronic Issues    HPI:  68 year old male with current prostate cancer, hypertension, hyperlipidemia presents for follow-up.  Hypertension stable on valsartan .  He follows closely with [facility] regarding his prostate cancer.  He is currently on Lupron .  He had an area of localized recurrence.  He has completed radiation and remains on Lupron .  His last dose is coming up in the near future.  He reports ongoing hot flashes, decrease libido, erectile dysfunction which is typical of treatment with Lupron .  He states that overall he is doing well.  He has had recent labs.  See care everywhere.  Patient declines immunizations.  The remainder of his preventative health care is up-to-date.  Patient Active Problem List   Diagnosis Date Noted   Allergy to alpha-gal 08/01/2023   Thyroid  nodule 06/29/2022   Essential hypertension 04/23/2021   Prostate cancer (HCC) 02/05/2019   Mixed stress and urge urinary incontinence 03/22/2018   S/P right knee arthroscopy 04/19/2017   Hyperlipidemia 10/09/2012    Social Hx   Social History   Socioeconomic History   Marital status: Single    Spouse name: Not on file   Number of children: Not on file   Years of education: Not on file   Highest education level: Associate degree: academic program  Occupational History   Not on file  Tobacco Use   Smoking status: Never   Smokeless tobacco: Never  Vaping Use   Vaping status: Never Used  Substance and Sexual Activity   Alcohol use: No   Drug use: No   Sexual activity: Not on file  Other Topics Concern   Not on file  Social History Narrative   Not on file   Social Drivers of Health   Financial Resource Strain: Low Risk  (01/31/2024)   Overall Financial Resource Strain (CARDIA)    Difficulty of Paying Living Expenses: Not hard at  all  Food Insecurity: No Food Insecurity (01/31/2024)   Hunger Vital Sign    Worried About Running Out of Food in the Last Year: Never true    Ran Out of Food in the Last Year: Never true  Transportation Needs: No Transportation Needs (01/31/2024)   PRAPARE - Administrator, Civil Service (Medical): No    Lack of Transportation (Non-Medical): No  Physical Activity: Sufficiently Active (01/31/2024)   Exercise Vital Sign    Days of Exercise per Week: 7 days    Minutes of Exercise per Session: 60 min  Stress: No Stress Concern Present (01/31/2024)   [employer] of Occupational Health - Occupational Stress Questionnaire    Feeling of Stress: Not at all  Social Connections: Socially Integrated (01/31/2024)   Social Connection and Isolation Panel    Frequency of Communication with Friends and Family: More than three times a week    Frequency of Social Gatherings with Friends and Family: Three times a week    Attends Religious Services: 1 to 4 times per year    Active Member of Clubs or Organizations: Yes    Attends Banker Meetings: 1 to 4 times per year    Marital Status: Married    Review of Systems Per HPI  Objective:  BP 129/83   Pulse 86   Ht 5' 8 (1.727 m)   Wt 178 lb (  80.7 kg)   SpO2 98%   BMI 27.06 kg/m      02/01/2024    9:03 AM 02/01/2024    8:36 AM 02/01/2024    8:35 AM  BP/Weight  Systolic BP 129 157 161  Diastolic BP 83 88 115  Wt. (Lbs)   178  BMI   27.06 kg/m2    Physical Exam Vitals and nursing note reviewed.  Constitutional:      General: He is not in acute distress.    Appearance: Normal appearance.  HENT:     Head: Normocephalic and atraumatic.  Eyes:     General:        Right eye: No discharge.        Left eye: No discharge.     Conjunctiva/sclera: Conjunctivae normal.  Cardiovascular:     Rate and Rhythm: Normal rate and regular rhythm.  Pulmonary:     Effort: Pulmonary effort is normal.     Breath sounds: Normal  breath sounds. No wheezing, rhonchi or rales.  Neurological:     Mental Status: He is alert.  Psychiatric:        Mood and Affect: Mood normal.        Behavior: Behavior normal.     Lab Results  Component Value Date   WBC 5.3 12/31/2022   HGB 16.8 12/31/2022   HCT 49.0 12/31/2022   PLT 202 12/31/2022   GLUCOSE 106 (H) 12/31/2022   CHOL 139 12/31/2022   TRIG 150 (H) 12/31/2022   HDL 33 (L) 12/31/2022   LDLCALC 80 12/31/2022   ALT 35 12/31/2022   AST 42 (H) 12/31/2022   NA 144 12/31/2022   K 4.3 12/31/2022   CL 102 12/31/2022   CREATININE 0.89 12/31/2022   BUN 9 12/31/2022   CO2 28 12/31/2022   PSA 3.71 09/13/2012   HGBA1C 5.4 03/26/2021     Assessment & Plan:  Essential hypertension Assessment & Plan: Stable.  Continue valsartan .  Orders: -     Valsartan ; Take 1 tablet (80 mg total) by mouth daily.  Dispense: 90 tablet; Refill: 3  Prostate cancer (HCC) Assessment & Plan: Stable at this time.  Following closely with [facility].   Allergy to alpha-gal Assessment & Plan: Patient reports that he has slowly reintroduced meat and is doing well at this time.     Follow-up:  Return in about 6 months (around 07/31/2024).  Jacqulyn Ahle DO [facility]

## 2024-02-01 NOTE — Assessment & Plan Note (Signed)
Stable.  Continue valsartan. 

## 2024-02-01 NOTE — Patient Instructions (Signed)
Continue your medication.  Follow-up in 6 months  Take care  Dr. Cook  

## 2024-02-15 DIAGNOSIS — Z79818 Long term (current) use of other agents affecting estrogen receptors and estrogen levels: Secondary | ICD-10-CM | POA: Diagnosis not present

## 2024-02-15 DIAGNOSIS — Z79899 Other long term (current) drug therapy: Secondary | ICD-10-CM | POA: Diagnosis not present

## 2024-02-15 DIAGNOSIS — C61 Malignant neoplasm of prostate: Secondary | ICD-10-CM | POA: Diagnosis not present

## 2024-02-15 DIAGNOSIS — Z5111 Encounter for antineoplastic chemotherapy: Secondary | ICD-10-CM | POA: Diagnosis not present

## 2024-02-15 DIAGNOSIS — Z923 Personal history of irradiation: Secondary | ICD-10-CM | POA: Diagnosis not present

## 2024-03-07 DIAGNOSIS — M546 Pain in thoracic spine: Secondary | ICD-10-CM | POA: Diagnosis not present

## 2024-03-07 DIAGNOSIS — M9901 Segmental and somatic dysfunction of cervical region: Secondary | ICD-10-CM | POA: Diagnosis not present

## 2024-03-07 DIAGNOSIS — M62830 Muscle spasm of back: Secondary | ICD-10-CM | POA: Diagnosis not present

## 2024-03-07 DIAGNOSIS — M9902 Segmental and somatic dysfunction of thoracic region: Secondary | ICD-10-CM | POA: Diagnosis not present

## 2024-03-07 DIAGNOSIS — M542 Cervicalgia: Secondary | ICD-10-CM | POA: Diagnosis not present

## 2024-03-07 DIAGNOSIS — M9903 Segmental and somatic dysfunction of lumbar region: Secondary | ICD-10-CM | POA: Diagnosis not present

## 2024-07-31 ENCOUNTER — Ambulatory Visit: Admitting: Family Medicine

## 2025-01-04 ENCOUNTER — Ambulatory Visit
